# Patient Record
Sex: Male | Born: 1985 | Race: Black or African American | Hispanic: No | Marital: Married | Smoking: Never smoker
Health system: Southern US, Community
[De-identification: ages and names within clinical notes are randomized; demographics above are authoritative.]

## PROBLEM LIST (undated history)

## (undated) DIAGNOSIS — F32A Depression, unspecified: Secondary | ICD-10-CM

## (undated) DIAGNOSIS — I10 Essential (primary) hypertension: Secondary | ICD-10-CM

## (undated) DIAGNOSIS — R000 Tachycardia, unspecified: Secondary | ICD-10-CM

## (undated) DIAGNOSIS — F419 Anxiety disorder, unspecified: Secondary | ICD-10-CM

## (undated) DIAGNOSIS — G8929 Other chronic pain: Secondary | ICD-10-CM

## (undated) DIAGNOSIS — S0291XA Unspecified fracture of skull, initial encounter for closed fracture: Secondary | ICD-10-CM

## (undated) HISTORY — DX: Anxiety disorder, unspecified: F41.9

## (undated) HISTORY — DX: Depression, unspecified: F32.A

## (undated) HISTORY — DX: Other chronic pain: G89.29

## (undated) HISTORY — DX: Tachycardia, unspecified: R00.0

---

## 2014-01-19 ENCOUNTER — Emergency Department: Payer: Self-pay | Admitting: Internal Medicine

## 2014-01-19 LAB — CBC WITH DIFFERENTIAL/PLATELET
Basophil #: 0 10*3/uL (ref 0.0–0.1)
Basophil %: 0.4 %
EOS ABS: 0.1 10*3/uL (ref 0.0–0.7)
EOS PCT: 0.6 %
HCT: 40.3 % (ref 40.0–52.0)
HGB: 12.8 g/dL — ABNORMAL LOW (ref 13.0–18.0)
LYMPHS PCT: 15.8 %
Lymphocyte #: 1.6 10*3/uL (ref 1.0–3.6)
MCH: 24.9 pg — ABNORMAL LOW (ref 26.0–34.0)
MCHC: 31.7 g/dL — ABNORMAL LOW (ref 32.0–36.0)
MCV: 79 fL — AB (ref 80–100)
MONOS PCT: 16.1 %
Monocyte #: 1.6 x10 3/mm — ABNORMAL HIGH (ref 0.2–1.0)
Neutrophil #: 6.7 10*3/uL — ABNORMAL HIGH (ref 1.4–6.5)
Neutrophil %: 67.1 %
Platelet: 230 10*3/uL (ref 150–440)
RBC: 5.12 10*6/uL (ref 4.40–5.90)
RDW: 16 % — ABNORMAL HIGH (ref 11.5–14.5)
WBC: 10 10*3/uL (ref 3.8–10.6)

## 2014-01-19 LAB — COMPREHENSIVE METABOLIC PANEL
ALK PHOS: 72 U/L
ANION GAP: 5 — AB (ref 7–16)
Albumin: 3.3 g/dL — ABNORMAL LOW (ref 3.4–5.0)
BUN: 9 mg/dL (ref 7–18)
Bilirubin,Total: 0.3 mg/dL (ref 0.2–1.0)
CALCIUM: 8.5 mg/dL (ref 8.5–10.1)
CO2: 28 mmol/L (ref 21–32)
Chloride: 101 mmol/L (ref 98–107)
Creatinine: 0.9 mg/dL (ref 0.60–1.30)
Glucose: 86 mg/dL (ref 65–99)
Osmolality: 266 (ref 275–301)
POTASSIUM: 3.9 mmol/L (ref 3.5–5.1)
SGOT(AST): 30 U/L (ref 15–37)
SGPT (ALT): 46 U/L
Sodium: 134 mmol/L — ABNORMAL LOW (ref 136–145)
TOTAL PROTEIN: 8.4 g/dL — AB (ref 6.4–8.2)

## 2014-01-19 LAB — RAPID INFLUENZA A&B ANTIGENS (ARMC ONLY)

## 2014-01-19 LAB — CK TOTAL AND CKMB (NOT AT ARMC)
CK, TOTAL: 261 U/L
CK-MB: 0.5 ng/mL (ref 0.5–3.6)

## 2014-01-21 LAB — BETA STREP CULTURE(ARMC)

## 2014-01-24 LAB — CULTURE, BLOOD (SINGLE)

## 2014-07-07 ENCOUNTER — Emergency Department: Payer: Self-pay

## 2014-08-28 ENCOUNTER — Emergency Department
Admission: EM | Admit: 2014-08-28 | Discharge: 2014-08-28 | Disposition: A | Discharge status: Home / Self Care | Payer: Worker's Compensation | Attending: Emergency Medicine | Admitting: Emergency Medicine

## 2014-08-28 ENCOUNTER — Encounter: Payer: Self-pay | Admitting: Emergency Medicine

## 2014-08-28 ENCOUNTER — Emergency Department: Payer: Worker's Compensation

## 2014-08-28 DIAGNOSIS — R0789 Other chest pain: Secondary | ICD-10-CM

## 2014-08-28 DIAGNOSIS — R079 Chest pain, unspecified: Secondary | ICD-10-CM | POA: Diagnosis present

## 2014-08-28 HISTORY — DX: Unspecified fracture of skull, initial encounter for closed fracture: S02.91XA

## 2014-08-28 MED ORDER — OXYCODONE-ACETAMINOPHEN 5-325 MG PO TABS
1.0000 | ORAL_TABLET | Freq: Once | ORAL | Status: AC
  Administered 2014-08-28: 1 via ORAL

## 2014-08-28 MED ORDER — OXYCODONE-ACETAMINOPHEN 5-325 MG PO TABS
ORAL_TABLET | ORAL | Status: AC
  Administered 2014-08-28: 1 via ORAL
  Filled 2014-08-28: qty 1

## 2014-08-28 MED ORDER — IBUPROFEN 800 MG PO TABS
ORAL_TABLET | ORAL | Status: AC
  Administered 2014-08-28: 800 mg via ORAL
  Filled 2014-08-28: qty 25

## 2014-08-28 MED ORDER — IBUPROFEN 800 MG PO TABS
800.0000 mg | ORAL_TABLET | Freq: Once | ORAL | Status: AC
  Administered 2014-08-28: 800 mg via ORAL

## 2014-08-28 MED ORDER — IBUPROFEN 800 MG PO TABS: 800.0000 mg | ORAL_TABLET | Freq: Three times a day (TID) | ORAL | Status: DC | PRN

## 2014-08-28 NOTE — ED Notes (Signed)
MD Brown at bedside.

## 2014-08-28 NOTE — ED Notes (Signed)
Patient ambulatory to triage with steady gait, without difficulty or distress noted; pt reports mid CP since 8pm after "picking up a beam"; st hx of same and dx with pulled symptoms; denies any accomp symptoms

## 2014-08-28 NOTE — ED Provider Notes (Signed)
[facility] Emergency Department Provider Note  ____________________________________________  Time seen: 6:05 AM  I have reviewed the triage vital signs and the nursing notes.   HISTORY  Chief Complaint Chest Pain      HPI Stephen Bautista is a 29 y.o. male presents with central chest pain worse with movement and deep breaths after lifting heavy object at work. Pain is currently 7 out of 10 and described as sharp    Past Medical History  Diagnosis Date  . Skull fracture     There are no active problems to display for this patient.   History reviewed. No pertinent past surgical history.  Current Outpatient Rx  Name  Route  Sig  Dispense  Refill  . ibuprofen (ADVIL,MOTRIN) 800 MG tablet   Oral   Take 1 tablet (800 mg total) by mouth every 8 (eight) hours as needed.   30 tablet   0     Allergies Review of patient's allergies indicates no known allergies.  No family history on file.  Social History History  Substance Use Topics  . Smoking status: Not on file  . Smokeless tobacco: Never Used  . Alcohol Use: No    Review of Systems  Constitutional: Negative for fever. Eyes: Negative for visual changes. ENT: Negative for sore throat. Cardiovascular: Positive for chest pain. Respiratory: Negative for shortness of breath. Gastrointestinal: Negative for abdominal pain, vomiting and diarrhea. Genitourinary: Negative for dysuria. Musculoskeletal: Negative for back pain. Skin: Negative for rash. Neurological: Negative for headaches, focal weakness or numbness.   10-point ROS otherwise negative.  ____________________________________________   PHYSICAL EXAM:  VITAL SIGNS: ED Triage Vitals  Enc Vitals Group     BP 08/28/14 0105 129/110 mmHg     Pulse Rate 08/28/14 0105 69     Resp 08/28/14 0435 16     Temp 08/28/14 0105 97.9 F (36.6 C)     Temp Source 08/28/14 0105 Oral     SpO2 08/28/14 0105 99 %     Weight 08/28/14  0105 280 lb (127.007 kg)     Height 08/28/14 0105 5\' 9"  (1.753 m)     Head Cir --      Peak Flow --      Pain Score 08/28/14 0432 5     Pain Loc --      Pain Edu? --      Excl. in GC? --      Constitutional: Alert and oriented. Well appearing and in no distress. Eyes: Conjunctivae are normal. PERRL. Normal extraocular movements. ENT   Head: Normocephalic and atraumatic.   Nose: No congestion/rhinnorhea.   Mouth/Throat: Mucous membranes are moist.   Neck: No stridor. Hematological/Lymphatic/Immunilogical: No cervical lymphadenopathy. Cardiovascular: Normal rate, regular rhythm. Normal and symmetric distal pulses are present in all extremities. No murmurs, rubs, or gallops. Pain with palpation along the pectoralis major insertion. Respiratory: Normal respiratory effort without tachypnea nor retractions. Breath sounds are clear and equal bilaterally. No wheezes/rales/rhonchi. Gastrointestinal: Soft and nontender. No distention. There is no CVA tenderness. Genitourinary: deferred Musculoskeletal: Nontender with normal range of motion in all extremities. No joint effusions.  No lower extremity tenderness nor edema. Neurologic:  Normal speech and language. No gross focal neurologic deficits are appreciated. Speech is normal.  Skin:  Skin is warm, dry and intact. No rash noted. Psychiatric: Mood and affect are normal. Speech and behavior are normal. Patient exhibits appropriate insight and judgment.  ____________________________________________     ____________________________________________   EKG  Date: 08/28/2014  Rate: 70  Rhythm: normal sinus rhythm  QRS Axis: normal  Intervals: normal  ST/T Wave abnormalities: normal  Conduction Disutrbances: none  Narrative Interpretation: unremarkable      ____________________________________________     RADIOLOGY    ____________________________________________    ____________________________________________   INITIAL IMPRESSION / ASSESSMENT AND PLAN / ED COURSE  Pertinent labs & imaging results that were available during my care of the patient were reviewed by me and considered in my medical decision making (see chart for details).  Given history and physical exam negative EKG and chest x-ray concern for chest wall pain patient received analgesia here in the emergency department will be discharged home with ibuprofen 800 mg.  ____________________________________________   FINAL CLINICAL IMPRESSION(S) / ED DIAGNOSES  Final diagnoses:  Anterior chest wall pain      Darci Currentandolph N Brown, MD 08/28/14 [phone]

## 2014-08-28 NOTE — ED Notes (Signed)
No further protocols ordered per Dr Manson PasseyBrown; ED tech in triage to complete workers comp profile requirements

## 2014-08-28 NOTE — Discharge Instructions (Signed)

## 2014-08-28 NOTE — ED Notes (Signed)
Patient transported to X-ray 

## 2014-08-28 NOTE — ED Notes (Signed)
Patient returned from X-ray 

## 2014-08-28 NOTE — ED Notes (Signed)
Pt provided with a copy of the return to work form.

## 2014-11-26 ENCOUNTER — Emergency Department: Payer: Self-pay

## 2014-11-26 ENCOUNTER — Emergency Department
Admission: EM | Admit: 2014-11-26 | Discharge: 2014-11-26 | Disposition: A | Discharge status: Home / Self Care | Payer: Self-pay | Attending: Emergency Medicine | Admitting: Emergency Medicine

## 2014-11-26 ENCOUNTER — Encounter: Payer: Self-pay | Admitting: Emergency Medicine

## 2014-11-26 ENCOUNTER — Other Ambulatory Visit: Payer: Self-pay

## 2014-11-26 ENCOUNTER — Emergency Department: Payer: PRIVATE HEALTH INSURANCE

## 2014-11-26 DIAGNOSIS — J690 Pneumonitis due to inhalation of food and vomit: Secondary | ICD-10-CM | POA: Insufficient documentation

## 2014-11-26 DIAGNOSIS — R079 Chest pain, unspecified: Secondary | ICD-10-CM | POA: Insufficient documentation

## 2014-11-26 LAB — BASIC METABOLIC PANEL
Anion gap: 6 (ref 5–15)
BUN: 7 mg/dL (ref 6–20)
CALCIUM: 9 mg/dL (ref 8.9–10.3)
CHLORIDE: 98 mmol/L — AB (ref 101–111)
CO2: 29 mmol/L (ref 22–32)
CREATININE: 0.83 mg/dL (ref 0.61–1.24)
GFR calc Af Amer: >60 mL/min (ref >60)
GFR calc non Af Amer: >60 mL/min (ref >60)
Glucose, Bld: 105 mg/dL — ABNORMAL HIGH (ref 65–99)
POTASSIUM: 3.8 mmol/L (ref 3.5–5.1)
Sodium: 133 mmol/L — ABNORMAL LOW (ref 135–145)

## 2014-11-26 LAB — FIBRIN DERIVATIVES D-DIMER (ARMC ONLY): FIBRIN DERIVATIVES D-DIMER (ARMC): 650 — AB (ref 0–499)

## 2014-11-26 LAB — CBC
HCT: 40.8 % (ref 40.0–52.0)
HEMOGLOBIN: 13.2 g/dL (ref 13.0–18.0)
MCH: 25.3 pg — ABNORMAL LOW (ref 26.0–34.0)
MCHC: 32.4 g/dL (ref 32.0–36.0)
MCV: 78.1 fL — AB (ref 80.0–100.0)
Platelets: 226 10*3/uL (ref 150–440)
RBC: 5.22 MIL/uL (ref 4.40–5.90)
RDW: 16 % — ABNORMAL HIGH (ref 11.5–14.5)
WBC: 7.6 10*3/uL (ref 3.8–10.6)

## 2014-11-26 LAB — TROPONIN I

## 2014-11-26 MED ORDER — HYDROCOD POLST-CPM POLST ER 10-8 MG/5ML PO SUER: 5.0000 mL | Freq: Two times a day (BID) | ORAL | Status: DC

## 2014-11-26 MED ORDER — OXYCODONE-ACETAMINOPHEN 5-325 MG PO TABS
2.0000 | ORAL_TABLET | Freq: Once | ORAL | Status: AC
  Administered 2014-11-26: 2 via ORAL

## 2014-11-26 MED ORDER — GI COCKTAIL ~~LOC~~
ORAL | Status: AC
  Administered 2014-11-26: 30 mL via ORAL
  Filled 2014-11-26: qty 30

## 2014-11-26 MED ORDER — CLINDAMYCIN HCL 300 MG PO CAPS: 600.0000 mg | ORAL_CAPSULE | Freq: Two times a day (BID) | ORAL | Status: DC

## 2014-11-26 MED ORDER — OXYCODONE-ACETAMINOPHEN 5-325 MG PO TABS
ORAL_TABLET | ORAL | Status: AC
  Administered 2014-11-26: 2 via ORAL
  Filled 2014-11-26: qty 2

## 2014-11-26 MED ORDER — GI COCKTAIL ~~LOC~~
30.0000 mL | Freq: Once | ORAL | Status: AC
  Administered 2014-11-26: 30 mL via ORAL

## 2014-11-26 MED ORDER — IOHEXOL 350 MG/ML SOLN
100.0000 mL | Freq: Once | INTRAVENOUS | Status: AC | PRN
  Administered 2014-11-26: 100 mL via INTRAVENOUS

## 2014-11-26 NOTE — ED Notes (Signed)
Patient to ED with report of chest pain that woke him up this morning, reports that initially he felt like he had swallowed something and was choking, eventually he reports some vomiting but it did not relieve the pain.

## 2014-11-26 NOTE — Discharge Instructions (Signed)
Aspiration Pneumonia  Aspiration pneumonia is an infection in your lungs. It occurs when food, liquid, or stomach contents (vomit) are inhaled (aspirated) into your lungs. When these things get into your lungs, swelling (inflammation) and infection can occur. This can make it difficult for you to breathe. Aspiration pneumonia is a serious condition and can be life threatening. RISK FACTORS Aspiration pneumonia is more likely to occur when a person's cough (gag) reflex or ability to swallow has been decreased. Some things that can do this include:   Having a brain injury or disease, such as stroke, seizures, Parkinson's disease, dementia, or amyotrophic lateral sclerosis (ALS).   Being given general anesthetic for procedures.   Being in a coma (unconscious).   Having a narrowing of the tube that carries food to the stomach (esophagus).   Drinking too much alcohol. If a person passes out and vomits, vomit can be swallowed into the lungs.   Taking certain medicines, such as tranquilizers or sedatives.  SIGNS AND SYMPTOMS   Coughing after swallowing food or liquids.   Breathing problems, such as wheezing or shortness of breath.   Bluish skin. This can be caused by lack of oxygen.   Coughing up food or mucus. The mucus might contain blood, greenish material, or yellowish-white fluid (pus).   Fever.   Chest pain.   Being more tired than usual (fatigue).   Sweating more than usual.   Bad breath.  DIAGNOSIS  A physical exam will be done. During the exam, the health care provider will listen to your lungs with a stethoscope to check for:   Crackling sounds in the lungs.  Decreased breath sounds.  A rapid heartbeat. Various tests may be ordered. These may include:   Chest X-ray.   CT scan.   Swallowing study. This test looks at how food is swallowed and whether it goes into your breathing tube (trachea) or food pipe (esophagus).   Sputum culture. Saliva and  mucus (sputum) are collected from the lungs or the tubes that carry air to the lungs (bronchi). The sputum is then tested for bacteria.   Bronchoscopy. This test uses a flexible tube (bronchoscope) to see inside the lungs. TREATMENT  Treatment will usually include antibiotic medicines. Other medicines may also be used to reduce fever or pain. You may need to be treated in the hospital. In the hospital, your breathing will be carefully monitored. Depending on how well you are breathing, you may need to be given oxygen, or you may need breathing support from a breathing machine (ventilator). For people who fail a swallowing study, a feeding tube might be placed in the stomach, or they may be asked to avoid certain food textures or liquids when they eat. HOME CARE INSTRUCTIONS   Carefully follow any special eating instructions you were given, such as avoiding certain food textures or thickening liquids. This reduces the risk of developing aspiration pneumonia again.  Only take over-the-counter or prescription medicines as directed by your health care provider. Follow the directions carefully.   If you were prescribed antibiotics, take them as directed. Finish them even if you start to feel better.   Rest as instructed by your health care provider.   Keep all follow-up appointments with your health care provider.  SEEK MEDICAL CARE IF:   You develop worsening shortness of breath, wheezing, or difficulty breathing.   You develop a fever.   You have chest pain.  MAKE SURE YOU:   Understand these instructions.  Will watch   condition.  Will get help right away if you are not doing well or get worse. Document Released: 02/01/2009 Document Revised: 04/11/2013 Document Reviewed: 09/22/2012 [facility] Patient Information 2015 [place], [place]. This information is not intended to replace advice given to you by your health care provider. Make sure you discuss any questions you have with  your health care provider.  Chest Pain (Nonspecific) It is often hard to give a specific diagnosis for the cause of chest pain. There is always a chance that your pain could be related to something serious, such as a heart attack or a blood clot in the lungs. You need to follow up with your health care provider for further evaluation. CAUSES   Heartburn.  Pneumonia or bronchitis.  Anxiety or stress.  Inflammation around your heart (pericarditis) or lung (pleuritis or pleurisy).  A blood clot in the lung.  A collapsed lung (pneumothorax). It can develop suddenly on its own (spontaneous pneumothorax) or from trauma to the chest.  Shingles infection (herpes zoster virus). The chest wall is composed of bones, muscles, and cartilage. Any of these can be the source of the pain.  The bones can be bruised by injury.  The muscles or cartilage can be strained by coughing or overwork.  The cartilage can be affected by inflammation and become sore (costochondritis). DIAGNOSIS  Lab tests or other studies may be needed to find the cause of your pain. Your health care provider may have you take a test called an ambulatory electrocardiogram (ECG). An ECG records your heartbeat patterns over a 24-hour period. You may also have other tests, such as:  Transthoracic echocardiogram (TTE). During echocardiography, sound waves are used to evaluate how blood flows through your heart.  Transesophageal echocardiogram (TEE).  Cardiac monitoring. This allows your health care provider to monitor your heart rate and rhythm in real time.  Holter monitor. This is a portable device that records your heartbeat and can help diagnose heart arrhythmias. It allows your health care provider to track your heart activity for several days, if needed.  Stress tests by exercise or by giving medicine that makes the heart beat faster. TREATMENT   Treatment depends on what may be causing your chest pain. Treatment may  include:  Acid blockers for heartburn.  Anti-inflammatory medicine.  Pain medicine for inflammatory conditions.  Antibiotics if an infection is present.  You may be advised to change lifestyle habits. This includes stopping smoking and avoiding alcohol, caffeine, and chocolate.  You may be advised to keep your head raised (elevated) when sleeping. This reduces the chance of acid going backward from your stomach into your esophagus. Most of the time, nonspecific chest pain will improve within 2-3 days with rest and mild pain medicine.  HOME CARE INSTRUCTIONS   If antibiotics were prescribed, take them as directed. Finish them even if you start to feel better.  For the next few days, avoid physical activities that bring on chest pain. Continue physical activities as directed.  Do not use any tobacco products, including cigarettes, chewing tobacco, or electronic cigarettes.  Avoid drinking alcohol.  Only take medicine as directed by your health care provider.  Follow your health care provider's suggestions for further testing if your chest pain does not go away.  Keep any follow-up appointments you made. If you do not go to an appointment, you could develop lasting (chronic) problems with pain. If there is any problem keeping an appointment, call to reschedule. SEEK MEDICAL CARE IF:   Your chest  pain does not go away, even after treatment.  You have a rash with blisters on your chest.  You have a fever. SEEK IMMEDIATE MEDICAL CARE IF:   You have increased chest pain or pain that spreads to your arm, neck, jaw, back, or abdomen.  You have shortness of breath.  You have an increasing cough, or you cough up blood.  You have severe back or abdominal pain.  You feel nauseous or vomit.  You have severe weakness.  You faint.  You have chills. This is an emergency. Do not wait to see if the pain will go away. Get medical help at once. Call your local emergency services (911  in [place] Do not drive yourself to the hospital. MAKE SURE YOU:   Understand these instructions.  Will watch your condition.  Will get help right away if you are not doing well or get worse. Document Released: 01/14/2005 Document Revised: 04/11/2013 Document Reviewed: 11/10/2007 [facility] Patient Information 2015 [place], [place]. This information is not intended to replace advice given to you by your health care provider. Make sure you discuss any questions you have with your health care provider.

## 2014-11-26 NOTE — ED Provider Notes (Signed)
[facility] Emergency Department Provider Note     Time seen: ----------------------------------------- 5:37 PM on 11/26/2014 -----------------------------------------    I have reviewed the triage vital signs and the nursing notes.   HISTORY  Chief Complaint Chest Pain    HPI Stephen Bautista is a 29 y.o. male who presents ER for chest pain that woke him up this morning. Patient reports initially felt like he swallowed something and was choking. Eventually he reports some vomiting that did not relieve any pain. There is some soreness to his chest, nothing sees make it better or worse. He was seen here in May, but this feels different.   Past Medical History  Diagnosis Date  . Skull fracture     There are no active problems to display for this patient.   History reviewed. No pertinent past surgical history.  Allergies Review of patient's allergies indicates no known allergies.  Social History History  Substance Use Topics  . Smoking status: Never Smoker   . Smokeless tobacco: Never Used  . Alcohol Use: No    Review of Systems Constitutional: Negative for fever. Eyes: Negative for visual changes. ENT: Negative for sore throat. Cardiovascular: Positive for chest pain Respiratory: Negative for shortness of breath. Gastrointestinal: Negative for abdominal pain, vomiting and diarrhea. Genitourinary: Negative for dysuria. Musculoskeletal: Negative for back pain. Skin: Negative for rash. Neurological: Negative for headaches, focal weakness or numbness.  10-point ROS otherwise negative.  ____________________________________________   PHYSICAL EXAM:  VITAL SIGNS: ED Triage Vitals  Enc Vitals Group     BP 11/26/14 1630 143/83 mmHg     Pulse Rate 11/26/14 1630 110     Resp 11/26/14 1630 20     Temp 11/26/14 1630 98.5 F (36.9 C)     Temp Source 11/26/14 1630 Oral     SpO2 11/26/14 1630 97 %     Weight 11/26/14 1630 270 lb  (122.471 kg)     Height 11/26/14 1630 5\' 10"  (1.778 m)     Head Cir --      Peak Flow --      Pain Score 11/26/14 1631 3     Pain Loc --      Pain Edu? --      Excl. in GC? --     Constitutional: Alert and oriented. Well appearing and in no distress. Eyes: Conjunctivae are normal. PERRL. Normal extraocular movements. ENT   Head: Normocephalic and atraumatic.   Nose: No congestion/rhinnorhea.   Mouth/Throat: Mucous membranes are moist.   Neck: No stridor. Cardiovascular: Normal rate, regular rhythm. Normal and symmetric distal pulses are present in all extremities. No murmurs, rubs, or gallops. Respiratory: Normal respiratory effort without tachypnea nor retractions. Breath sounds are clear and equal bilaterally. No wheezes/rales/rhonchi. Gastrointestinal: Soft and nontender. No distention. No abdominal bruits.  Musculoskeletal: Nontender with normal range of motion in all extremities. No joint effusions.  No lower extremity tenderness nor edema. There is some midsternal reproducible chest wall tenderness Neurologic:  Normal speech and language. No gross focal neurologic deficits are appreciated. Speech is normal. No gait instability. Skin:  Skin is warm, dry and intact. No rash noted. Psychiatric: Mood and affect are normal. Speech and behavior are normal. Patient exhibits appropriate insight and judgment. ____________________________________________  EKG: Interpreted by me. Sinus tachycardia with a rate of 107 bpm, inverted T waves inferior laterally, normal PR interval, normal QS with, normal QT interval.  ____________________________________________  ED COURSE:  Pertinent labs & imaging results that were  available during my care of the patient were reviewed by me and considered in my medical decision making (see chart for details). Patient a cardiac labs, consider d-dimer and chest x-ray ____________________________________________    LABS (pertinent  positives/negatives)  Labs Reviewed  BASIC METABOLIC PANEL - Abnormal; Notable for the following:    Sodium 133 (*)    Chloride 98 (*)    Glucose, Bld 105 (*)    All other components within normal limits  CBC - Abnormal; Notable for the following:    MCV 78.1 (*)    MCH 25.3 (*)    RDW 16.0 (*)    All other components within normal limits  FIBRIN DERIVATIVES D-DIMER ([facility] ONLY) - Abnormal; Notable for the following:    Fibrin derivatives D-dimer ([facility]) 650 (*)    All other components within normal limits  TROPONIN I    RADIOLOGY Images were viewed by me  Chest x-ray  IMPRESSION: 1. No evidence of pulmonary embolism. 2. There is a spectrum findings in the right lower lobe which suggests sequela of mild aspiration. Alternatively, this could reflect a developing bronchopneumonia. 3. Mild air trapping, indicative of mild small airways disease. 4. Mild cardiomegaly. ____________________________________________  FINAL ASSESSMENT AND PLAN  Chest pain, likely aspiration pneumonia  Plan: Patient with labs and imaging as dictated above. Patient will be discharged with antibiotic coverage for aspiration pneumonia. He'll be given cough medicine to take, is encouraged to follow-up in 2-3 days for reevaluation.   Emily Filbert, MD   Emily Filbert, MD 11/26/14 [phone]

## 2015-04-30 ENCOUNTER — Encounter: Payer: Self-pay | Admitting: Emergency Medicine

## 2015-04-30 ENCOUNTER — Emergency Department
Admission: EM | Admit: 2015-04-30 | Discharge: 2015-04-30 | Disposition: A | Discharge status: Home / Self Care | Payer: PRIVATE HEALTH INSURANCE | Attending: Emergency Medicine | Admitting: Emergency Medicine

## 2015-04-30 DIAGNOSIS — K529 Noninfective gastroenteritis and colitis, unspecified: Secondary | ICD-10-CM | POA: Insufficient documentation

## 2015-04-30 DIAGNOSIS — R112 Nausea with vomiting, unspecified: Secondary | ICD-10-CM | POA: Diagnosis present

## 2015-04-30 LAB — CBC WITH DIFFERENTIAL/PLATELET
BASOS ABS: 0 10*3/uL (ref 0–0.1)
BASOS PCT: 0 %
EOS PCT: 4 %
Eosinophils Absolute: 0.3 10*3/uL (ref 0–0.7)
HCT: 41.4 % (ref 40.0–52.0)
Hemoglobin: 13 g/dL (ref 13.0–18.0)
LYMPHS ABS: 2 10*3/uL (ref 1.0–3.6)
Lymphocytes Relative: 29 %
MCH: 24.2 pg — AB (ref 26.0–34.0)
MCHC: 31.5 g/dL — AB (ref 32.0–36.0)
MCV: 76.8 fL — AB (ref 80.0–100.0)
Monocytes Absolute: 0.8 10*3/uL (ref 0.2–1.0)
Monocytes Relative: 12 %
NEUTROS PCT: 55 %
Neutro Abs: 3.6 10*3/uL (ref 1.4–6.5)
PLATELETS: 231 10*3/uL (ref 150–440)
RBC: 5.39 MIL/uL (ref 4.40–5.90)
RDW: 15.6 % — AB (ref 11.5–14.5)
WBC: 6.7 10*3/uL (ref 3.8–10.6)

## 2015-04-30 LAB — COMPREHENSIVE METABOLIC PANEL
ALT: 37 U/L (ref 17–63)
ANION GAP: 3 — AB (ref 5–15)
AST: 30 U/L (ref 15–41)
Albumin: 3.8 g/dL (ref 3.5–5.0)
Alkaline Phosphatase: 68 U/L (ref 38–126)
BUN: 11 mg/dL (ref 6–20)
CHLORIDE: 106 mmol/L (ref 101–111)
CO2: 28 mmol/L (ref 22–32)
CREATININE: 0.79 mg/dL (ref 0.61–1.24)
Calcium: 8.4 mg/dL — ABNORMAL LOW (ref 8.9–10.3)
Glucose, Bld: 89 mg/dL (ref 65–99)
Potassium: 3.7 mmol/L (ref 3.5–5.1)
Sodium: 137 mmol/L (ref 135–145)
Total Bilirubin: 0.5 mg/dL (ref 0.3–1.2)
Total Protein: 8.4 g/dL — ABNORMAL HIGH (ref 6.5–8.1)

## 2015-04-30 LAB — LIPASE, BLOOD: LIPASE: 29 U/L (ref 11–51)

## 2015-04-30 MED ORDER — LOPERAMIDE HCL 2 MG PO CAPS
4.0000 mg | ORAL_CAPSULE | Freq: Once | ORAL | Status: AC
  Administered 2015-04-30: 4 mg via ORAL
  Filled 2015-04-30: qty 2

## 2015-04-30 MED ORDER — ONDANSETRON HCL 4 MG/2ML IJ SOLN
4.0000 mg | Freq: Once | INTRAMUSCULAR | Status: AC
  Administered 2015-04-30: 4 mg via INTRAVENOUS
  Filled 2015-04-30: qty 2

## 2015-04-30 MED ORDER — SODIUM CHLORIDE 0.9 % IV SOLN
1000.0000 mL | Freq: Once | INTRAVENOUS | Status: AC
  Administered 2015-04-30: 1000 mL via INTRAVENOUS

## 2015-04-30 MED ORDER — DICYCLOMINE HCL 20 MG PO TABS: 20.0000 mg | ORAL_TABLET | Freq: Three times a day (TID) | ORAL | Status: DC | PRN

## 2015-04-30 MED ORDER — ONDANSETRON HCL 4 MG PO TABS: 4.0000 mg | ORAL_TABLET | Freq: Every day | ORAL | Status: DC | PRN

## 2015-04-30 MED ORDER — MORPHINE SULFATE (PF) 4 MG/ML IV SOLN
4.0000 mg | Freq: Once | INTRAVENOUS | Status: AC
Start: 2015-04-30 — End: 2015-04-30
  Administered 2015-04-30: 4 mg via INTRAVENOUS
  Filled 2015-04-30: qty 1

## 2015-04-30 NOTE — ED Notes (Signed)
Pt states he has taken alka seltzer, ginger ale and pepto with no relief.

## 2015-04-30 NOTE — ED Notes (Signed)
Pt given water and crackers for PO challenge

## 2015-04-30 NOTE — ED Provider Notes (Signed)
[facility] Emergency Department Provider Note     Time seen: ----------------------------------------- 8:03 AM on 04/30/2015 -----------------------------------------    I have reviewed the triage vital signs and the nursing notes.   HISTORY  Chief Complaint Abdominal Cramping    HPI Myer Bohlman is a 30 y.o. male who presents to ERfor nausea vomiting and diarrhea. Patient states his diarrhea started on Saturday morning and estimates she's had 5-6 loose stools daily. His also thrown up 3 times while using the bathroom. His appetite is decreased, he's been taking Alka-Seltzer and Pepto-Bismol without any improvement. Dark stools concerned him this morning. He denies fevers chills or other complaints.   Past Medical History  Diagnosis Date  . Skull fracture (HCC)     There are no active problems to display for this patient.   History reviewed. No pertinent past surgical history.  Allergies Review of patient's allergies indicates no known allergies.  Social History Social History  Substance Use Topics  . Smoking status: Never Smoker   . Smokeless tobacco: Never Used  . Alcohol Use: No    Review of Systems Constitutional: Negative for fever. Eyes: Negative for visual changes. ENT: Negative for sore throat. Cardiovascular: Negative for chest pain. Respiratory: Negative for shortness of breath. Gastrointestinal:positive for abdominal cramping, nausea vomiting and diarrhea Genitourinary: Negative for dysuria. Musculoskeletal: Negative for back pain. Skin: Negative for rash. Neurological: Negative for headaches, focal weakness or numbness.  10-point ROS otherwise negative.  ____________________________________________   PHYSICAL EXAM:  VITAL SIGNS: ED Triage Vitals  Enc Vitals Group     BP 04/30/15 0742 145/80 mmHg     Pulse Rate 04/30/15 0742 76     Resp 04/30/15 0742 16     Temp 04/30/15 0742 98.1 F (36.7 C)     Temp  Source 04/30/15 0742 Oral     SpO2 04/30/15 0742 99 %     Weight 04/30/15 0742 275 lb (124.739 kg)     Height 04/30/15 0742 5\' 9"  (1.753 m)     Head Cir --      Peak Flow --      Pain Score 04/30/15 0743 6     Pain Loc --      Pain Edu? --      Excl. in GC? --     Constitutional: Alert and oriented. Well appearing and in no distress. Eyes: Conjunctivae are normal. PERRL. Normal extraocular movements. ENT   Head: Normocephalic and atraumatic.   Nose: No congestion/rhinnorhea.   Mouth/Throat: Mucous membranes are moist.   Neck: No stridor. Cardiovascular: Normal rate, regular rhythm. Normal and symmetric distal pulses are present in all extremities. No murmurs, rubs, or gallops. Respiratory: Normal respiratory effort without tachypnea nor retractions. Breath sounds are clear and equal bilaterally. No wheezes/rales/rhonchi. Gastrointestinal: mild abdominal tenderness, no rebound or guarding. Normal bowel sounds. Musculoskeletal: Nontender with normal range of motion in all extremities. No joint effusions.  No lower extremity tenderness nor edema. Neurologic:  Normal speech and language. No gross focal neurologic deficits are appreciated. Speech is normal. No gait instability. Skin:  Skin is warm, dry and intact. No rash noted. Psychiatric: Mood and affect are normal. Speech and behavior are normal. Patient exhibits appropriate insight and judgment. ____________________________________________  ED COURSE:  Pertinent labs & imaging results that were available during my care of the patient were reviewed by me and considered in my medical decision making (see chart for details). Patient likely would nor a virus infection, we'll give IV  fluid, IV morphine and Zofran. ____________________________________________    LABS (pertinent positives/negatives)  Labs Reviewed  CBC WITH DIFFERENTIAL/PLATELET - Abnormal; Notable for the following:    MCV 76.8 (*)    MCH 24.2 (*)     MCHC 31.5 (*)    RDW 15.6 (*)    All other components within normal limits  COMPREHENSIVE METABOLIC PANEL - Abnormal; Notable for the following:    Calcium 8.4 (*)    Total Protein 8.4 (*)    Anion gap 3 (*)    All other components within normal limits  LIPASE, BLOOD  ____________________________________________  FINAL ASSESSMENT AND PLAN  Gastroenteritis  Plan: Patient with labs and imaging as dictated above. Patient is feeling better and has tolerated by mouth well here. He'll be discharged with antiemetics and Bentyl. Have advised Imodium for diarrhea. He stable for outpatient follow-up with his doctor. Black stool that he noted is almost certainly related to the Pepto-Bismol intake.   Emily FilbertWilliams, Garnell Phenix E, MD   Emily FilbertJonathan E Yanni Ruberg, MD 04/30/15 [phone]

## 2015-04-30 NOTE — ED Notes (Signed)
Pt verbalized understanding of discharge instructions. NAD at this time. 

## 2015-04-30 NOTE — Discharge Instructions (Signed)
Norovirus Infection °A norovirus infection is caused by exposure to a virus in a group of similar viruses (noroviruses). This type of infection causes inflammation in your stomach and intestines (gastroenteritis). Norovirus is the most common cause of gastroenteritis. It also causes food poisoning. °Anyone can get a norovirus infection. It spreads very easily (contagious). You can get it from contaminated food, water, surfaces, or other people. Norovirus is found in the stool or vomit of infected people. You can spread the infection as soon as you feel sick until 2 weeks after you recover.  °Symptoms usually begin within 2 days after you become infected. Most norovirus symptoms affect the digestive system. °CAUSES °Norovirus infection is caused by contact with norovirus. You can catch norovirus if you: °· Eat or drink something contaminated with norovirus. °· Touch surfaces or objects contaminated with norovirus and then put your hand in your mouth. °· Have direct contact with an infected person who has symptoms. °· Share food, drink, or utensils with someone with who is sick with norovirus. °SIGNS AND SYMPTOMS °Symptoms of norovirus may include: °· Nausea. °· Vomiting. °· Diarrhea. °· Stomach cramps. °· Fever. °· Chills. °· Headache. °· Muscle aches. °· Tiredness. °DIAGNOSIS °Your health care provider may suspect norovirus based on your symptoms and physical exam. Your health care provider may also test a sample of your stool or vomit for the virus.  °TREATMENT °There is no specific treatment for norovirus. Most people get better without treatment in about 2 days. °HOME CARE INSTRUCTIONS °· Replace lost fluids by drinking plenty of water or rehydration fluids containing important minerals called electrolytes. This prevents dehydration. Drink enough fluid to keep your urine clear or pale yellow. °· Do not prepare food for others while you are infected. Wait at least 3 days after recovering from the illness to do  that. °PREVENTION  °· Wash your hands often, especially after using the toilet or changing a diaper. °· Wash fruits and vegetables thoroughly before preparing or serving them. °· Throw out any food that a sick person may have touched. °· Disinfect contaminated surfaces immediately after someone in the household has been sick. Use a bleach-based household cleaner. °· Immediately remove and wash soiled clothes or sheets. °SEEK MEDICAL CARE IF: °· Your vomiting, diarrhea, and stomach pain is getting worse. °· Your symptoms of norovirus do not go away after 2-3 days. °SEEK IMMEDIATE MEDICAL CARE IF:  °You develop symptoms of dehydration that do not improve with fluid replacement. This may include: °· Excessive sleepiness. °· Lack of tears. °· Dry mouth. °· Dizziness when standing. °· Weak pulse. °  °This information is not intended to replace advice given to you by your health care provider. Make sure you discuss any questions you have with your health care provider. °  °Document Released: 06/27/2002 Document Revised: 04/27/2014 Document Reviewed: 09/14/2013 °Elsevier Interactive Patient Education ©2016 Else[employer] Inc. ° °

## 2015-04-30 NOTE — ED Notes (Signed)
Pt was able to tolerate crackers and water without vomiting.

## 2015-04-30 NOTE — ED Notes (Signed)
Patient states his stomach began "rolling" with loose stools starting on Sat. AM, estimates 5-6 stools daily. Emesis X 3 while using bathroom. Appetite is decreased.  Burping "egg flavor"  States his stomach hurts when he touches it. Took Pepto-Bismol and Alka Seltzer.  States his stool dark black this AM.

## 2016-07-30 ENCOUNTER — Emergency Department
Admission: EM | Admit: 2016-07-30 | Discharge: 2016-07-30 | Disposition: A | Discharge status: Home / Self Care | Payer: PRIVATE HEALTH INSURANCE | Attending: Emergency Medicine | Admitting: Emergency Medicine

## 2016-07-30 ENCOUNTER — Encounter: Payer: Self-pay | Admitting: Emergency Medicine

## 2016-07-30 DIAGNOSIS — A0811 Acute gastroenteropathy due to Norwalk agent: Secondary | ICD-10-CM | POA: Insufficient documentation

## 2016-07-30 DIAGNOSIS — A084 Viral intestinal infection, unspecified: Secondary | ICD-10-CM | POA: Insufficient documentation

## 2016-07-30 LAB — COMPREHENSIVE METABOLIC PANEL
ALK PHOS: 64 U/L (ref 38–126)
ALT: 31 U/L (ref 17–63)
ANION GAP: 7 (ref 5–15)
AST: 28 U/L (ref 15–41)
Albumin: 4 g/dL (ref 3.5–5.0)
BUN: 9 mg/dL (ref 6–20)
CO2: 24 mmol/L (ref 22–32)
Calcium: 8.6 mg/dL — ABNORMAL LOW (ref 8.9–10.3)
Chloride: 104 mmol/L (ref 101–111)
Creatinine, Ser: 0.76 mg/dL (ref 0.61–1.24)
GFR calc Af Amer: >60 mL/min (ref >60)
GFR calc non Af Amer: >60 mL/min (ref >60)
GLUCOSE: 90 mg/dL (ref 65–99)
POTASSIUM: 3.6 mmol/L (ref 3.5–5.1)
Sodium: 135 mmol/L (ref 135–145)
Total Bilirubin: 0.4 mg/dL (ref 0.3–1.2)
Total Protein: 8.6 g/dL — ABNORMAL HIGH (ref 6.5–8.1)

## 2016-07-30 LAB — CBC
HEMATOCRIT: 41.9 % (ref 40.0–52.0)
HEMOGLOBIN: 13.6 g/dL (ref 13.0–18.0)
MCH: 25.6 pg — AB (ref 26.0–34.0)
MCHC: 32.5 g/dL (ref 32.0–36.0)
MCV: 78.8 fL — AB (ref 80.0–100.0)
Platelets: 261 10*3/uL (ref 150–440)
RBC: 5.32 MIL/uL (ref 4.40–5.90)
RDW: 15.3 % — AB (ref 11.5–14.5)
WBC: 7.4 10*3/uL (ref 3.8–10.6)

## 2016-07-30 LAB — LIPASE, BLOOD: LIPASE: 25 U/L (ref 11–51)

## 2016-07-30 MED ORDER — MORPHINE SULFATE (PF) 4 MG/ML IV SOLN
4.0000 mg | Freq: Once | INTRAVENOUS | Status: AC
  Administered 2016-07-30: 4 mg via INTRAVENOUS
  Filled 2016-07-30: qty 1

## 2016-07-30 MED ORDER — PROMETHAZINE HCL 25 MG PO TABS
25.0000 mg | ORAL_TABLET | Freq: Once | ORAL | Status: AC
  Administered 2016-07-30: 25 mg via ORAL
  Filled 2016-07-30: qty 1

## 2016-07-30 MED ORDER — ONDANSETRON HCL 4 MG/2ML IJ SOLN
4.0000 mg | Freq: Once | INTRAMUSCULAR | Status: AC
  Administered 2016-07-30: 4 mg via INTRAVENOUS
  Filled 2016-07-30: qty 2

## 2016-07-30 MED ORDER — SODIUM CHLORIDE 0.9 % IV SOLN
Freq: Once | INTRAVENOUS | Status: AC
  Administered 2016-07-30: 11:00:00 via INTRAVENOUS

## 2016-07-30 MED ORDER — DICYCLOMINE HCL 20 MG PO TABS: 20.0000 mg | ORAL_TABLET | Freq: Three times a day (TID) | ORAL | 0 refills | Status: DC | PRN

## 2016-07-30 MED ORDER — ONDANSETRON 4 MG PO TBDP: 4.0000 mg | ORAL_TABLET | Freq: Three times a day (TID) | ORAL | 0 refills | Status: DC | PRN

## 2016-07-30 NOTE — ED Provider Notes (Addendum)
[facility] Emergency Department Provider Note       Time seen: ----------------------------------------- 10:59 AM on 07/30/2016 -----------------------------------------     I have reviewed the triage vital signs and the nursing notes.   HISTORY   Chief Complaint Abdominal Pain    HPI Stephen Bautista is a 31 y.o. male who presents to the ED for abdominal pain with vomiting and diarrhea for the last 2 days. Patient states he has generalized pain, no fevers or chills, he feels like he may become dehydrated. Patient thinks he may have been from something that he ate, otherwise denies focal complaints   Past Medical History:  Diagnosis Date  . Skull fracture (HCC)     There are no active problems to display for this patient.   History reviewed. No pertinent surgical history.  Allergies Patient has no known allergies.  Social History Social History  Substance Use Topics  . Smoking status: Never Smoker  . Smokeless tobacco: Never Used  . Alcohol use No    Review of Systems Constitutional: Negative for fever. Cardiovascular: Negative for chest pain. Respiratory: Negative for shortness of breath. Gastrointestinal: Positive for abdominal pain, vomiting and diarrhea Genitourinary: Negative for dysuria. Musculoskeletal: Negative for back pain. Positive for myalgias Skin: Negative for rash. Neurological: Negative for headaches, focal weakness or numbness.  10-point ROS otherwise negative.  ____________________________________________   PHYSICAL EXAM:  VITAL SIGNS: ED Triage Vitals  Enc Vitals Group     BP 07/30/16 1041 139/80     Pulse Rate 07/30/16 1041 77     Resp 07/30/16 1041 18     Temp 07/30/16 1041 98.5 F (36.9 C)     Temp Source 07/30/16 1041 Oral     SpO2 07/30/16 1041 99 %     Weight 07/30/16 1040 275 lb (124.7 kg)     Height 07/30/16 1040  (1.778 m)     Head Circumference --      Peak Flow --      Pain  Score 07/30/16 1040 8     Pain Loc --      Pain Edu? --      Excl. in GC? --     Constitutional: Alert and oriented. Well appearing and in no distress. Eyes: Conjunctivae are normal. PERRL. Normal extraocular movements. ENT   Head: Normocephalic and atraumatic.   Nose: No congestion/rhinnorhea.   Mouth/Throat: Mucous membranes are moist.   Neck: No stridor. Cardiovascular: Normal rate, regular rhythm. No murmurs, rubs, or gallops. Respiratory: Normal respiratory effort without tachypnea nor retractions. Breath sounds are clear and equal bilaterally. No wheezes/rales/rhonchi. Gastrointestinal: Soft and nontender. Normal bowel sounds Musculoskeletal: Nontender with normal range of motion in extremities. No lower extremity tenderness nor edema. Neurologic:  Normal speech and language. No gross focal neurologic deficits are appreciated.  Skin:  Skin is warm, dry and intact. No rash noted. Psychiatric: Mood and affect are normal. Speech and behavior are normal.  ____________________________________________  ED COURSE:  Pertinent labs & imaging results that were available during my care of the patient were reviewed by me and considered in my medical decision making (see chart for details). Patient presents for symptoms of gastroenteritis, we will assess with labs and imaging as indicated.   Procedures ____________________________________________   LABS (pertinent positives/negatives)  Labs Reviewed  CBC - Abnormal; Notable for the following:       Result Value   MCV 78.8 (*)    MCH 25.6 (*)    RDW 15.3 (*)  All other components within normal limits  LIPASE, BLOOD  COMPREHENSIVE METABOLIC PANEL  URINALYSIS, COMPLETE (UACMP) WITH MICROSCOPIC   ____________________________________________  FINAL ASSESSMENT AND PLAN  Gastroenteritis  Plan: Patient's labs were dictated above. Patient had presented for vomiting and diarrhea, likely Norovirus infection. He's  received fluids as well as antiemetics and antidiarrheal agents. Currently he is stable for outpatient follow-up.   Emily Filbert, MD   Note: This note was generated in part or whole with voice recognition software. Voice recognition is usually quite accurate but there are transcription errors that can and very often do occur. I apologize for any typographical errors that were not detected and corrected.     Emily Filbert, MD 07/30/16 1100    Emily Filbert, MD 07/30/16 1101

## 2016-07-30 NOTE — ED Triage Notes (Signed)
Generalized abdominal pain with vomiting and diarrhea X 2 days. Has also had generalized pain. No urinary sx. No fevers.

## 2016-10-16 ENCOUNTER — Encounter: Payer: Self-pay | Admitting: Emergency Medicine

## 2016-10-16 ENCOUNTER — Emergency Department
Admission: EM | Admit: 2016-10-16 | Discharge: 2016-10-16 | Disposition: A | Discharge status: Home / Self Care | Payer: PRIVATE HEALTH INSURANCE | Attending: Student in an Organized Health Care Education/Training Program | Admitting: Student in an Organized Health Care Education/Training Program

## 2016-10-16 DIAGNOSIS — K0889 Other specified disorders of teeth and supporting structures: Secondary | ICD-10-CM | POA: Insufficient documentation

## 2016-10-16 MED ORDER — KETOROLAC TROMETHAMINE 10 MG PO TABS: 10.0000 mg | ORAL_TABLET | Freq: Four times a day (QID) | ORAL | 0 refills | Status: AC | PRN

## 2016-10-16 MED ORDER — TRAMADOL HCL 50 MG PO TABS: 50.0000 mg | ORAL_TABLET | Freq: Four times a day (QID) | ORAL | 0 refills | Status: AC | PRN

## 2016-10-16 MED ORDER — TRAMADOL HCL 50 MG PO TABS
50.0000 mg | ORAL_TABLET | Freq: Once | ORAL | Status: AC
  Administered 2016-10-16: 50 mg via ORAL
  Filled 2016-10-16: qty 1

## 2016-10-16 MED ORDER — KETOROLAC TROMETHAMINE 30 MG/ML IJ SOLN
30.0000 mg | Freq: Once | INTRAMUSCULAR | Status: AC
  Administered 2016-10-16: 30 mg via INTRAMUSCULAR
  Filled 2016-10-16: qty 1

## 2016-10-16 MED ORDER — AMOXICILLIN 500 MG PO TABS: 500.0000 mg | ORAL_TABLET | Freq: Three times a day (TID) | ORAL | 0 refills | Status: AC

## 2016-10-16 MED ORDER — LIDOCAINE VISCOUS 2 % MT SOLN
15.0000 mL | Freq: Once | OROMUCOSAL | Status: AC
  Administered 2016-10-16: 15 mL via OROMUCOSAL
  Filled 2016-10-16: qty 15

## 2016-10-16 NOTE — ED Triage Notes (Signed)
Pt. States upper rt. Tooth pain for awhile.  Pt. States chewing on food today cracked and expo used tooth nerve.

## 2016-10-16 NOTE — ED Provider Notes (Signed)
[facility] Emergency Department Provider Note  ____________________________________________  Time seen: Approximately 8:38 PM  I have reviewed the triage vital signs and the nursing notes.   HISTORY  Chief Complaint Dental Pain    HPI Stephen Bautista is a 31 y.o. male presenting to the emergency department with 8/10 throbbing and aching dental pain from Superior 1. Patient states that tooth cracked last night. Patient has noticed no fever, chills or gingival hypertrophy. No alleviating measures have been attempted   Past Medical History:  Diagnosis Date  . Skull fracture (HCC)     There are no active problems to display for this patient.   History reviewed. No pertinent surgical history.  Prior to Admission medications   Medication Sig Start Date End Date Taking? Authorizing Provider  amoxicillin (AMOXIL) 500 MG tablet Take 1 tablet (500 mg total) by mouth 3 (three) times daily. 10/16/16 10/26/16  Orvil FeilWoods, Jaclyn M, PA-C  dicyclomine (BENTYL) 20 MG tablet Take 1 tablet (20 mg total) by mouth 3 (three) times daily as needed for spasms. 07/30/16   Emily FilbertWilliams, Jonathan E, MD  ketorolac (TORADOL) 10 MG tablet Take 1 tablet (10 mg total) by mouth every 6 (six) hours as needed. 10/16/16 10/21/16  Orvil FeilWoods, Jaclyn M, PA-C  ondansetron (ZOFRAN ODT) 4 MG disintegrating tablet Take 1 tablet (4 mg total) by mouth every 8 (eight) hours as needed for nausea or vomiting. 07/30/16   Emily FilbertWilliams, Jonathan E, MD  traMADol (ULTRAM) 50 MG tablet Take 1 tablet (50 mg total) by mouth every 6 (six) hours as needed. 10/16/16 10/19/16  Orvil FeilWoods, Jaclyn M, PA-C    Allergies Patient has no known allergies.  History reviewed. No pertinent family history.  Social History Social History  Substance Use Topics  . Smoking status: Never Smoker  . Smokeless tobacco: Never Used  . Alcohol use No   Review of Systems  Constitutional: No fever/chills Eyes: No visual changes. No discharge ENT:  Patient has Superior 1 pain.  Cardiovascular: no chest pain. Respiratory: no cough. No SOB. Gastrointestinal: No abdominal pain.  No nausea, no vomiting.  No diarrhea.  No constipation. Musculoskeletal: Negative for musculoskeletal pain. Skin: Negative for rash, abrasions, lacerations, ecchymosis. Neurological: Negative for headaches, focal weakness or numbness.  ____________________________________________   PHYSICAL EXAM:  VITAL SIGNS: ED Triage Vitals  Enc Vitals Group     BP 10/16/16 1916 (!) 116/48     Pulse Rate 10/16/16 1916 91     Resp 10/16/16 1916 18     Temp 10/16/16 1916 99 F (37.2 C)     Temp Source 10/16/16 1916 Oral     SpO2 10/16/16 1916 99 %     Weight 10/16/16 1916 270 lb (122.5 kg)     Height 10/16/16 1916 5\' 10"  (1.778 m)     Head Circumference --      Peak Flow --      Pain Score 10/16/16 1915 8     Pain Loc --      Pain Edu? --      Excl. in GC? --      Constitutional: Alert and oriented. Well appearing and in no acute distress. Eyes: Conjunctivae are normal. PERRL. EOMI. Head: Atraumatic.      Mouth/Throat: Mucous membranes are moist. Patient has cracked superior 1. Hematological/Lymphatic/Immunilogical: No cervical lymphadenopathy. Cardiovascular: Normal rate, regular rhythm. Normal S1 and S2.  Good peripheral circulation. Respiratory: Normal respiratory effort without tachypnea or retractions. Lungs CTAB. Good air entry to the bases  with no decreased or absent breath sounds. Gastrointestinal: Bowel sounds 4 quadrants. Soft and nontender to palpation. No guarding or rigidity. No palpable masses. No distention. No CVA tenderness. Musculoskeletal: Full range of motion to all extremities. No gross deformities appreciated. Neurologic:  Normal speech and language. No gross focal neurologic deficits are appreciated.  Skin:  Skin is warm, dry and intact. No rash noted. Psychiatric: Mood and affect are normal. Speech and behavior are normal. Patient  exhibits appropriate insight and judgement.   ____________________________________________   LABS (all labs ordered are listed, but only abnormal results are displayed)  Labs Reviewed - No data to display ____________________________________________  EKG   ____________________________________________  RADIOLOGY  No results found.  ____________________________________________    PROCEDURES  Procedure(s) performed:    Procedures    Medications  ketorolac (TORADOL) 30 MG/ML injection 30 mg (30 mg Intramuscular Given 10/16/16 2052)  lidocaine (XYLOCAINE) 2 % viscous mouth solution 15 mL (15 mLs Mouth/Throat Given 10/16/16 2052)  traMADol (ULTRAM) tablet 50 mg (50 mg Oral Given 10/16/16 2052)     ____________________________________________   INITIAL IMPRESSION / ASSESSMENT AND PLAN / ED COURSE  Pertinent labs & imaging results that were available during my care of the patient were reviewed by me and considered in my medical decision making (see chart for details).  Review of the [place] CSRS was performed in accordance of the [facility] prior to dispensing any controlled drugs.     Assessment and plan: Dental pain: Patient presents to the emergency department with a cracked superior 1. He was given viscous lidocaine, tramadol, and toradol in the emergency department. He was discharged with amoxicillin and tramadol. Vital signs were reassuring prior to discharge. All patient questions were answered ____________________________________________  FINAL CLINICAL IMPRESSION(S) / ED DIAGNOSES  Final diagnoses:  Pain, dental      NEW MEDICATIONS STARTED DURING THIS VISIT:  Discharge Medication List as of 10/16/2016  8:33 PM    START taking these medications   Details  amoxicillin (AMOXIL) 500 MG tablet Take 1 tablet (500 mg total) by mouth 3 (three) times daily., Starting Fri 10/16/2016, Until Mon 10/26/2016, Print    ketorolac (TORADOL) 10 MG tablet Take 1 tablet (10 mg  total) by mouth every 6 (six) hours as needed., Starting Fri 10/16/2016, Until Wed 10/21/2016, Print    traMADol (ULTRAM) 50 MG tablet Take 1 tablet (50 mg total) by mouth every 6 (six) hours as needed., Starting Fri 10/16/2016, Until Mon 10/19/2016, Print            This chart was dictated using voice recognition software/Dragon. Despite best efforts to proofread, errors can occur which can change the meaning. Any change was purely unintentional.    Orvil Feil, PA-C 10/17/16 1747    Willy Eddy, MD 10/17/16 [phone]

## 2016-10-16 NOTE — Discharge Instructions (Signed)
OPTIONS FOR DENTAL FOLLOW UP CARE ° °[place] Department [facility] [employer] and [facility] - Local Safety Net Dental Clinics °http://www.ncdhhs.gov/dph/oralhealth/services/safetynetclinics.htm °  °[facility] (336-562-3123) ° °[facility] [place] ([phone]) ° °[facility] ([phone] ext [phone]) ° ° [facility] ([phone]) ° °[facility] ([phone]) °This clinic caters to the indigent population and is on a lottery system. °Location: °[facility], [address], [place] °Clinic Hours: °Wednesdays from 6pm - 9pm, patients seen by a lottery system. °For dates, call or go to [email or web address] °Services: °Cleanings, fillings and simple extractions. °Payment Options: °DENTAL WORK IS FREE OF CHARGE. Bring proof of income or support. °Best way to get seen: °Arrive at 5:15 pm - this is a lottery, NOT first come/first serve, so arriving earlier will not increase your chances of being seen. °  °  °[facility] °[phone] °Select option 1 for emergencies °  °Location: °[facility], [address], [place] °Clinic Hours: °No walk-ins accepted - call the day before to schedule an appointment. °Check in times are 9:30 am and 1:30 pm. °Services: °Simple extractions, temporary fillings, pulpectomy/pulp debridement, uncomplicated abscess drainage. °Payment Options: °PAYMENT IS DUE AT THE TIME OF SERVICE.  Fee is usually $100-200, additional surgical procedures (e.g. abscess drainage) may be extra. °Cash, checks, Visa/MasterCard accepted.  Can file Medicaid if patient is covered for dental - patient should call case worker to check. °No discount for [facility] patients. °Best way to get seen: °MUST call the day before and get onto the schedule. Can usually be seen the next 1-2 days. No walk-ins accepted. °  °  °[facility] °[phone] °   °Location: °[facility], [address], [place] °Clinic Hours: °M, W, Th, F 8am or 1:30pm, Tues 9a or 1:30 - first come/first served. °Services: °Simple extractions, temporary fillings, uncomplicated abscess drainage.  You do not need to be an [place] resident. °Payment Options: °PAYMENT IS DUE AT THE TIME OF SERVICE. °Dental insurance, otherwise sliding scale - bring proof of income or support. °Depending on income and treatment needed, cost is usually $50-200. °Best way to get seen: °Arrive early as it is first come/first served. °  °  °[facility] °[phone] °  °Location: °[address] °Clinic Hours: °Mon-Thu 8a-5p °Services: °Most basic dental services including extractions and fillings. °Payment Options: °PAYMENT IS DUE AT THE TIME OF SERVICE. °Sliding scale, up to 50% off - bring proof if income or support. °Medicaid with dental option accepted. °Best way to get seen: °Call to schedule an appointment, can usually be seen within 2 weeks OR they will try to see walk-ins - show up at 8a or 2p (you may have to wait). °  °  °[facility] °[phone] °[place] RESIDENTS ONLY °  °Location: °[facility], [address], [place], [place] [address] °Clinic Hours: By appointment only. °Monday - Thursday 8am-5pm, Friday 8am-12pm °Services: Cleanings, fillings, extractions. °Payment Options: °PAYMENT IS DUE AT THE TIME OF SERVICE. °Cash, Visa or MasterCard. Sliding scale - $30 minimum per service. °Best way to get seen: °Come in to office, complete packet and make an appointment - need proof of income °or support monies for each household member and proof of [place] residence. °Usually takes about a month to get in. °  °  °[facility] °[phone] °  °Location: °[address]   [place] °Clinic Hours: Walk-in Urgent Care Dental Services are offered Monday-Friday  mornings only. °The numbers of emergencies accepted daily is limited to the number of °providers available. °Maximum 15 - Mondays, Wednesdays & Thursdays °Maximum 10 - Tuesdays & Fridays °Services: °You do not need to be a [place] resident to be seen for a dental emergency. °Emergencies are defined as pain, swelling, abnormal bleeding, or dental trauma. Walkins will receive x-rays if needed. °NOTE: Dental cleaning is not an emergency. °Payment Options: °PAYMENT IS DUE AT THE TIME OF SERVICE. °Minimum co-pay is $40.00 for uninsured patients. °Minimum co-pay is $3.00 for Medicaid with dental coverage. °Dental Insurance is accepted and must be presented at time of visit. °Medicare does not cover dental. °Forms of payment: Cash, credit card, checks. °Best way to get seen: °If not previously registered with the clinic, walk-in dental registration begins at 7:15 am and is on a first come/first serve basis. °If previously registered with the clinic, call to make an appointment. °  °  °[facility] °[phone] °[place] RESIDENTS ONLY °  °Location: °[address], [place], [place] °Clinic Hours: °Mon-Thu 10a-2p °Services: Extractions only! °Payment Options: °FREE (donations accepted) - bring proof of income or support °Best way to get seen: °Call and schedule an appointment OR come at 8am on the 1st Monday of every month (except for holidays) when it is first come/first served. °  °  °[facility] °[phone] °  °Location: °[address], [place] °Clinic Hours: °Friday mornings °Services, Payment Options, Best way to get seen: °Call for info °

## 2017-11-26 ENCOUNTER — Other Ambulatory Visit: Payer: Self-pay

## 2017-11-26 ENCOUNTER — Emergency Department
Admission: EM | Admit: 2017-11-26 | Discharge: 2017-11-26 | Disposition: A | Discharge status: Home / Self Care | Payer: Worker's Compensation | Attending: Emergency Medicine | Admitting: Emergency Medicine

## 2017-11-26 DIAGNOSIS — Z79899 Other long term (current) drug therapy: Secondary | ICD-10-CM | POA: Insufficient documentation

## 2017-11-26 DIAGNOSIS — T754XXA Electrocution, initial encounter: Secondary | ICD-10-CM | POA: Diagnosis not present

## 2017-11-26 DIAGNOSIS — R251 Tremor, unspecified: Secondary | ICD-10-CM | POA: Insufficient documentation

## 2017-11-26 LAB — CBC
HEMATOCRIT: 39.8 % — AB (ref 40.0–52.0)
Hemoglobin: 13.2 g/dL (ref 13.0–18.0)
MCH: 26.2 pg (ref 26.0–34.0)
MCHC: 33.1 g/dL (ref 32.0–36.0)
MCV: 79 fL — AB (ref 80.0–100.0)
PLATELETS: 254 10*3/uL (ref 150–440)
RBC: 5.04 MIL/uL (ref 4.40–5.90)
RDW: 15.7 % — AB (ref 11.5–14.5)
WBC: 6.7 10*3/uL (ref 3.8–10.6)

## 2017-11-26 LAB — COMPREHENSIVE METABOLIC PANEL
ALT: 28 U/L (ref 0–44)
AST: 27 U/L (ref 15–41)
Albumin: 4 g/dL (ref 3.5–5.0)
Alkaline Phosphatase: 59 U/L (ref 38–126)
Anion gap: 9 (ref 5–15)
BILIRUBIN TOTAL: 0.4 mg/dL (ref 0.3–1.2)
BUN: 12 mg/dL (ref 6–20)
CHLORIDE: 103 mmol/L (ref 98–111)
CO2: 26 mmol/L (ref 22–32)
CREATININE: 0.68 mg/dL (ref 0.61–1.24)
Calcium: 9.1 mg/dL (ref 8.9–10.3)
Glucose, Bld: 94 mg/dL (ref 70–99)
POTASSIUM: 4 mmol/L (ref 3.5–5.1)
Sodium: 138 mmol/L (ref 135–145)
TOTAL PROTEIN: 8.6 g/dL — AB (ref 6.5–8.1)

## 2017-11-26 LAB — CK: CK TOTAL: 230 U/L (ref 49–397)

## 2017-11-26 MED ORDER — IBUPROFEN 600 MG PO TABS: 600.0000 mg | ORAL_TABLET | Freq: Four times a day (QID) | ORAL | 0 refills | Status: DC | PRN

## 2017-11-26 MED ORDER — CYCLOBENZAPRINE HCL 5 MG PO TABS: 5.0000 mg | ORAL_TABLET | Freq: Three times a day (TID) | ORAL | 0 refills | Status: DC | PRN

## 2017-11-26 NOTE — ED Provider Notes (Signed)
[facility] Emergency Department Provider Note  Time seen: 1:38 PM  I have reviewed the triage vital signs and the nursing notes.   HISTORY  Chief Complaint Electric Shock    HPI Stephen MesiMarcus Calvin Bautista is a 32 y.o. male with no significant past medical history who presents to the emergency department after a likely electrocution.  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  He reached his hand back behind the machine to attempt to fix it and remembers waking up on the ground next.  Patient states he lifted up the plastic that covers the electrical work of the machine, believes he was likely electrocuted.  States he woke up sitting on the ground does not believe he hit his head.  Denies any pain at this time but states he feels somewhat shaky across his body especially in his right arm.   Past Medical History:  Diagnosis Date  . Skull fracture (HCC)     There are no active problems to display for this patient.   No past surgical history on file.  Prior to Admission medications   Medication Sig Start Date End Date Taking? Authorizing Provider  dicyclomine (BENTYL) 20 MG tablet Take 1 tablet (20 mg total) by mouth 3 (three) times daily as needed for spasms. 07/30/16   Emily FilbertWilliams, Jonathan E, MD  ondansetron (ZOFRAN ODT) 4 MG disintegrating tablet Take 1 tablet (4 mg total) by mouth every 8 (eight) hours as needed for nausea or vomiting. 07/30/16   Emily FilbertWilliams, Jonathan E, MD    No Known Allergies  No family history on file.  Social History Social History   Tobacco Use  . Smoking status: Never Smoker  . Smokeless tobacco: Never Used  Substance Use Topics  . Alcohol use: No    Alcohol/week: 0.0 standard drinks  . Drug use: No    Review of Systems Constitutional: Possible brief LOC. Eyes: Negative for visual complaints ENT: Negative for recent illness/congestion Cardiovascular: Negative for chest pain. Respiratory:  Negative for shortness of breath. Gastrointestinal: Negative for abdominal pain, vomiting Musculoskeletal: Feels shaky and tingly through body. Skin: Negative for skin complaints  Neurological: Negative for headache.  Tingling throughout body. All other ROS negative  ____________________________________________   PHYSICAL EXAM:  VITAL SIGNS: ED Triage Vitals [11/26/17 1307]  Enc Vitals Group     BP (!) 157/100     Pulse Rate 85     Resp 18     Temp 98 F (36.7 C)     Temp Source Oral     SpO2 96 %     Weight 272 lb (123.4 kg)     Height 5\' 9"  (1.753 m)     Head Circumference      Peak Flow      Pain Score 8     Pain Loc      Pain Edu?      Excl. in GC?    Constitutional: Alert and oriented. Well appearing and in no distress. Eyes: Normal exam ENT   Head: Normocephalic and atraumatic.   Mouth/Throat: Mucous membranes are moist. Cardiovascular: Normal rate, regular rhythm. No murmur Respiratory: Normal respiratory effort without tachypnea nor retractions. Breath sounds are clear Gastrointestinal: Soft and nontender. No distention. Musculoskeletal: Nontender with normal range of motion in all extremities.  No signs of compartment syndrome, burn, logical entrance/exit wound in the upper extremities. Neurologic:  Normal speech and language. No gross focal neurologic deficits Skin:  Skin  is warm, dry and intact.  Psychiatric: Mood and affect are normal.   ____________________________________________    EKG  EKG reviewed and interpreted by myself shows normal sinus rhythm at 97 bpm with a narrow QRS, normal axis, normal intervals, nonspecific ST changes without ST elevation.  ____________________________________________   INITIAL IMPRESSION / ASSESSMENT AND PLAN / ED COURSE  Pertinent labs & imaging results that were available during my care of the patient were reviewed by me and considered in my medical decision making (see chart for details).  Patient  presents to the emergency department after alleged use and possible brief LOC.  States this occurred at approximately 11 AM this morning.  Patient is nearly symptom-free besides feeling of tingling and shakiness throughout his body.  We will check labs including a total CK.  We will treat with Norco for the patient's reported back pain and continue to closely monitor.  Overall the patient appears very well.  EKG is reassuring.  Labs are reassuring including CK level.  I discussed with the patient Flexeril as needed for back discomfort, Tylenol or ibuprofen for pain and significant hydration over the next 2 days.  Patient agreeable to plan of care.  ____________________________________________   FINAL CLINICAL IMPRESSION(S) / ED DIAGNOSES  Electrocution injury   Minna Antis, MD 11/26/17 1445

## 2017-11-26 NOTE — ED Triage Notes (Addendum)
Pt reports that he touched an exposed wire at work around 1140, states he is unsure if he passed out but remembers waking up on the floor hurting all over, states that he felt sob and was very weak initially took him a few minutes to gather strength to stand. Pt states bilat forearm to his fingers tips area feels numb. Pt reports pain in the center of his back

## 2017-11-26 NOTE — ED Notes (Signed)
Pt presents at bedside with supervisor HR manager, aunt and sister. HR manager asking questions about care and the Workers compensation information (test). Questions were directed to the EDP. RN will monitor.

## 2017-11-26 NOTE — ED Notes (Signed)
.   Pt is resting, Respirations even and unlabored, NAD. Stretcher lowest postion and locked. Call bell within reach. Denies any needs at this time RN will continue to monitor.    

## 2017-11-26 NOTE — ED Notes (Signed)
Completed Urine Drug screen collection at the "post accident" request of pt's employer rep, Belinda Journigan "Dry Process Manager". Single urine specimen bagged, required paperwork included, hand carried to lab for testing.

## 2017-11-28 ENCOUNTER — Emergency Department: Payer: Worker's Compensation

## 2017-11-28 ENCOUNTER — Encounter: Payer: Self-pay | Admitting: Emergency Medicine

## 2017-11-28 ENCOUNTER — Emergency Department
Admission: EM | Admit: 2017-11-28 | Discharge: 2017-11-28 | Disposition: A | Discharge status: Home / Self Care | Payer: Worker's Compensation | Attending: Emergency Medicine | Admitting: Emergency Medicine

## 2017-11-28 ENCOUNTER — Other Ambulatory Visit: Payer: Self-pay

## 2017-11-28 DIAGNOSIS — M546 Pain in thoracic spine: Secondary | ICD-10-CM | POA: Insufficient documentation

## 2017-11-28 DIAGNOSIS — R202 Paresthesia of skin: Secondary | ICD-10-CM | POA: Insufficient documentation

## 2017-11-28 DIAGNOSIS — Z79899 Other long term (current) drug therapy: Secondary | ICD-10-CM | POA: Diagnosis not present

## 2017-11-28 MED ORDER — HYDROCODONE-ACETAMINOPHEN 5-325 MG PO TABS: 1.0000 | ORAL_TABLET | ORAL | 0 refills | Status: DC | PRN

## 2017-11-28 MED ORDER — MORPHINE SULFATE (PF) 4 MG/ML IV SOLN
4.0000 mg | Freq: Once | INTRAVENOUS | Status: AC
  Administered 2017-11-28: 4 mg via INTRAMUSCULAR

## 2017-11-28 MED ORDER — MORPHINE SULFATE (PF) 4 MG/ML IV SOLN
INTRAVENOUS | Status: AC
  Filled 2017-11-28: qty 1

## 2017-11-28 NOTE — ED Notes (Signed)
Cup of water and blanket provided to pt.

## 2017-11-28 NOTE — ED Provider Notes (Signed)
[facility] Emergency Department Provider Note  Time seen: 2:20 PM  I have reviewed the triage vital signs and the nursing notes.   HISTORY  Chief Complaint Back Pain and Numbness (hands bilaterally)    HPI Stephen Bautista is a 32 y.o. male with no past medical history who presents to the emergency department for back pain and difficulty sleeping due to the back pain.  According to the patient he was at work Friday and suffered an electrocution injury.  Possibly passed out possibly fell backwards although he states he awoke, slumped over on the ground.  I personally saw the patient on Friday in the emergency department, labs are reassuring including CK.  Patient states he is continued to have mid to upper central back pain ever since the incident.  States the pain and soreness has increased and is now up to his neck and occasionally has pains in his arms.  He stated on Friday when I saw him in the emergency department he was having tingling sensations in the arms which she related to the electrocution event.  Currently the patient appears well.  Describes his pain as a constant 6 out of 10 in the back, states it will shoot up to a 10 out of 10 with certain movements such as twisting or standing/sitting up.   Past Medical History:  Diagnosis Date  . Skull fracture (HCC)     There are no active problems to display for this patient.   History reviewed. No pertinent surgical history.  Prior to Admission medications   Medication Sig Start Date End Date Taking? Authorizing Provider  cyclobenzaprine (FLEXERIL) 5 MG tablet Take 1 tablet (5 mg total) by mouth 3 (three) times daily as needed for muscle spasms. 11/26/17   Minna Antis, MD  dicyclomine (BENTYL) 20 MG tablet Take 1 tablet (20 mg total) by mouth 3 (three) times daily as needed for spasms. Patient not taking: Reported on 11/26/2017 07/30/16   Emily Filbert, MD  ibuprofen (ADVIL,MOTRIN) 600 MG  tablet Take 1 tablet (600 mg total) by mouth every 6 (six) hours as needed. 11/26/17   Minna Antis, MD  ondansetron (ZOFRAN ODT) 4 MG disintegrating tablet Take 1 tablet (4 mg total) by mouth every 8 (eight) hours as needed for nausea or vomiting. Patient not taking: Reported on 11/26/2017 07/30/16   Emily Filbert, MD    No Known Allergies  No family history on file.  Social History Social History   Tobacco Use  . Smoking status: Never Smoker  . Smokeless tobacco: Never Used  Substance Use Topics  . Alcohol use: No    Alcohol/week: 0.0 standard drinks  . Drug use: No    Review of Systems Constitutional: Negative for fever. Cardiovascular: Negative for chest pain. Respiratory: Negative for shortness of breath. Gastrointestinal: Negative for abdominal pain Genitourinary: Negative for urinary compaints Musculoskeletal: 6/10 mid upper back pain Skin: Negative for skin complaints  Neurological: Negative for headache.  Occasional tingling in upper extremities. All other ROS negative  ____________________________________________   PHYSICAL EXAM:  VITAL SIGNS: ED Triage Vitals  Enc Vitals Group     BP 11/28/17 1141 (!) 159/111     Pulse Rate 11/28/17 1140 95     Resp 11/28/17 1140 18     Temp 11/28/17 1140 98.4 F (36.9 C)     Temp Source 11/28/17 1140 Oral     SpO2 11/28/17 1140 100 %     Weight 11/28/17 1141 272 lb (  123.4 kg)     Height 11/28/17 1141 5\' 9"  (1.753 m)     Head Circumference --      Peak Flow --      Pain Score 11/28/17 1141 6     Pain Loc --      Pain Edu? --      Excl. in GC? --     Constitutional: Alert and oriented. Well appearing and in no distress. Eyes: Normal exam ENT   Head: Normocephalic and atraumatic.   Mouth/Throat: Mucous membranes are moist. Cardiovascular: Normal rate, regular rhythm. No murmur Respiratory: Normal respiratory effort without tachypnea nor retractions. Breath sounds are clear  Gastrointestinal: Soft  and nontender. No distention.   Musculoskeletal: Nontender with normal range of motion in all extremities.  Normal-appearing upper extremities, great grip strengths.  Soft, no concern of compartment syndrome.  No visible areas concerning for burn.  Moderate mid thoracic tenderness to palpation more so in the paraspinal muscles than centrally.  No C-spine tenderness. Neurologic:  Normal speech and language. No gross focal neurologic deficits.  Equal grip strength bilaterally.  No pronator drift. Skin:  Skin is warm, dry and intact.  Psychiatric: Mood and affect are normal. Speech and behavior are normal.   ____________________________________________   RADIOLOGY  Thoracic x-ray negative  ____________________________________________   INITIAL IMPRESSION / ASSESSMENT AND PLAN / ED COURSE  Pertinent labs & imaging results that were available during my care of the patient were reviewed by me and considered in my medical decision making (see chart for details).  Patient presents to the emergency department for central mid to upper back pain.  Differential would include muscle strain, traumatic injury.  Will obtain x-ray imaging as a precaution.  We will dose pain medication while awaiting imaging results.  Patient agreeable to plan of care.  Thoracic x-ray negative.  Patient states improvement after pain medication.  Highly suspect muscular strain.  We will discharge the patient with a short course of pain medication.  I did discuss with the patient I can write him a note for the next day or 2 off work however if he is desiring FMLA paperwork or paid time off he needs to see his primary care doctor tomorrow.  Patient agreeable to plan of care.  ____________________________________________   FINAL CLINICAL IMPRESSION(S) / ED DIAGNOSES  Back pain Muscular pain   Minna AntisPaduchowski, Lilas Diefendorf, MD 11/28/17 1516

## 2017-11-28 NOTE — ED Notes (Signed)
Patient transported to X-ray 

## 2017-11-28 NOTE — ED Triage Notes (Signed)
Tingling in hands bilaterally, and pain in back up to his neck, and tightness in his shoulders, was seen here Friday after touching an exposed wire at work. Appears in NAD.

## 2017-11-28 NOTE — ED Notes (Signed)
Workers Comp case from Friday.

## 2018-01-06 ENCOUNTER — Emergency Department
Admission: EM | Admit: 2018-01-06 | Discharge: 2018-01-06 | Disposition: A | Discharge status: Home / Self Care | Payer: PRIVATE HEALTH INSURANCE | Attending: Emergency Medicine | Admitting: Emergency Medicine

## 2018-01-06 ENCOUNTER — Other Ambulatory Visit: Payer: Self-pay

## 2018-01-06 ENCOUNTER — Encounter: Payer: Self-pay | Admitting: Emergency Medicine

## 2018-01-06 DIAGNOSIS — I10 Essential (primary) hypertension: Secondary | ICD-10-CM | POA: Insufficient documentation

## 2018-01-06 DIAGNOSIS — Z79899 Other long term (current) drug therapy: Secondary | ICD-10-CM | POA: Insufficient documentation

## 2018-01-06 DIAGNOSIS — G44209 Tension-type headache, unspecified, not intractable: Secondary | ICD-10-CM

## 2018-01-06 HISTORY — DX: Essential (primary) hypertension: I10

## 2018-01-06 MED ORDER — DIPHENHYDRAMINE HCL 50 MG/ML IJ SOLN
12.5000 mg | Freq: Once | INTRAMUSCULAR | Status: AC
  Administered 2018-01-06: 12.5 mg via INTRAVENOUS
  Filled 2018-01-06: qty 1

## 2018-01-06 MED ORDER — DIAZEPAM 2 MG PO TABS: 2.0000 mg | ORAL_TABLET | Freq: Three times a day (TID) | ORAL | 0 refills | Status: DC | PRN

## 2018-01-06 MED ORDER — DIAZEPAM 2 MG PO TABS
2.0000 mg | ORAL_TABLET | Freq: Once | ORAL | Status: AC
  Administered 2018-01-06: 2 mg via ORAL
  Filled 2018-01-06: qty 1

## 2018-01-06 MED ORDER — KETOROLAC TROMETHAMINE 30 MG/ML IJ SOLN
15.0000 mg | Freq: Once | INTRAMUSCULAR | Status: AC
  Administered 2018-01-06: 15 mg via INTRAVENOUS
  Filled 2018-01-06: qty 1

## 2018-01-06 MED ORDER — DEXTROSE-NACL 5-0.45 % IV SOLN
INTRAVENOUS | Status: DC
  Administered 2018-01-06: 04:00:00 via INTRAVENOUS

## 2018-01-06 NOTE — ED Notes (Signed)
Pt states that he has had a headache for the last couple of days that has been persistent and makes him unable to sleep at night. Pt AxOx4.

## 2018-01-06 NOTE — ED Provider Notes (Signed)
[facility] Emergency Department Provider Note   ____________________________________________   First MD Initiated Contact with Patient 01/06/18 [phone]     (approximate)  I have reviewed the triage vital signs and the nursing notes.   HISTORY  Chief Complaint Headache    HPI Stephen Bautista is a 32 y.o. male who presents to the ED from home with a chief complaint of headache.  Patient has a history of headaches when his blood pressure is elevated.  Reports gradual onset of headache while at work yesterday at 1 PM.  Describes headache to the right side of his head associated with photophobia.  No associated fever, chills, chest pain, shortness of breath, abdominal pain, nausea or vomiting.  Had a thoracic back injury approximately 1 month ago for which he is still taking muscle relaxer.  Thought his headache was due to high blood pressure so he took an extra amlodipine approximately 2.5 hours ago.  Denies recent travel or trauma.  Denies slurred speech, altered mentation, extremity weakness, numbness or tingling.   Past Medical History:  Diagnosis Date  . Hypertension   . Skull fracture (HCC)     There are no active problems to display for this patient.   History reviewed. No pertinent surgical history.  Prior to Admission medications   Medication Sig Start Date End Date Taking? Authorizing Provider  cyclobenzaprine (FLEXERIL) 5 MG tablet Take 1 tablet (5 mg total) by mouth 3 (three) times daily as needed for muscle spasms. 11/26/17   Minna AntisPaduchowski, Kevin, MD  diazepam (VALIUM) 2 MG tablet Take 1 tablet (2 mg total) by mouth every 8 (eight) hours as needed for muscle spasms. 01/06/18   Irean HongSung, Juquan Reznick J, MD  dicyclomine (BENTYL) 20 MG tablet Take 1 tablet (20 mg total) by mouth 3 (three) times daily as needed for spasms. Patient not taking: Reported on 11/26/2017 07/30/16   Emily FilbertWilliams, Jonathan E, MD  HYDROcodone-acetaminophen (NORCO/VICODIN) 5-325 MG tablet Take  1 tablet by mouth every 4 (four) hours as needed. 11/28/17   Minna AntisPaduchowski, Kevin, MD  ibuprofen (ADVIL,MOTRIN) 600 MG tablet Take 1 tablet (600 mg total) by mouth every 6 (six) hours as needed. 11/26/17   Minna AntisPaduchowski, Kevin, MD  ondansetron (ZOFRAN ODT) 4 MG disintegrating tablet Take 1 tablet (4 mg total) by mouth every 8 (eight) hours as needed for nausea or vomiting. Patient not taking: Reported on 11/26/2017 07/30/16   Emily FilbertWilliams, Jonathan E, MD    Allergies Patient has no known allergies.  Family History Sister with migraine headaches  Social History Social History   Tobacco Use  . Smoking status: Never Smoker  . Smokeless tobacco: Never Used  Substance Use Topics  . Alcohol use: No    Alcohol/week: 0.0 standard drinks  . Drug use: No    Review of Systems  Constitutional: No fever/chills Eyes: No visual changes. ENT: No sore throat. Cardiovascular: Denies chest pain. Respiratory: Denies shortness of breath. Gastrointestinal: No abdominal pain.  No nausea, no vomiting.  No diarrhea.  No constipation. Genitourinary: Negative for dysuria. Musculoskeletal: Negative for back pain. Skin: Negative for rash. Neurological: Positive for headache.  Negative for focal weakness or numbness.   ____________________________________________   PHYSICAL EXAM:  VITAL SIGNS: ED Triage Vitals  Enc Vitals Group     BP 01/06/18 0317 (!) 146/86     Pulse Rate 01/06/18 0317 96     Resp 01/06/18 0317 18     Temp 01/06/18 0317 98.4 F (36.9 C)  Temp Source 01/06/18 0317 Oral     SpO2 01/06/18 0317 100 %     Weight 01/06/18 0315 290 lb (131.5 kg)     Height 01/06/18 0315 5\' 9"  (1.753 m)     Head Circumference --      Peak Flow --      Pain Score 01/06/18 0314 10     Pain Loc --      Pain Edu? --      Excl. in GC? --     Constitutional: Alert and oriented. Well appearing and in mild acute distress. Eyes: Conjunctivae are normal. PERRL. EOMI. Head: Atraumatic. Nose: No  congestion/rhinnorhea. Mouth/Throat: Mucous membranes are moist.  Oropharynx non-erythematous. Neck: No stridor.  Supple neck without meningismus.  No carotid bruits.  Right trapezius muscle spasms. Cardiovascular: Normal rate, regular rhythm. Grossly normal heart sounds.  Good peripheral circulation. Respiratory: Normal respiratory effort.  No retractions. Lungs CTAB. Gastrointestinal: Soft and nontender. No distention. No abdominal bruits. No CVA tenderness. Musculoskeletal: No lower extremity tenderness nor edema.  No joint effusions. Neurologic: Alert and oriented x3.  CN II to XII grossly intact.  Normal speech and language. No gross focal neurologic deficits are appreciated. No gait instability. Skin:  Skin is warm, dry and intact. No rash noted.  No vesicles or petechiae. Psychiatric: Mood and affect are normal. Speech and behavior are normal.  ____________________________________________   LABS (all labs ordered are listed, but only abnormal results are displayed)  Labs Reviewed - No data to display ____________________________________________  EKG  None ____________________________________________  RADIOLOGY  ED MD interpretation: None  Official radiology report(s): No results found.  ____________________________________________   PROCEDURES  Procedure(s) performed: None  Procedures  Critical Care performed: No  ____________________________________________   INITIAL IMPRESSION / ASSESSMENT AND PLAN / ED COURSE  As part of my medical decision making, I reviewed the following data within the electronic MEDICAL RECORD NUMBER History obtained from family, Nursing notes reviewed and incorporated, Old chart reviewed and Notes from prior ED visits   32 year old male who presents with right-sided headache for 12 hours. Differential diagnosis includes, but is not limited to, intracranial hemorrhage, meningitis/encephalitis, previous head trauma, cavernous venous  thrombosis, tension headache, temporal arteritis, migraine or migraine equivalent, idiopathic intracranial hypertension, and non-specific headache.  Patient took an extra amlodipine approximately 2.5 hours ago so his blood pressure is currently manageable.  Neck is supple and patient does not exhibit focal neurological deficits.  Will administer 2 mg oral Valium for muscle spasms,  D5 half-normal saline infusion, 15 mg IV Toradol for pain paired with 12.5 mg IV Benadryl for nausea.  Will reassess.  Clinical Course as of Jan 06 500  Thu Jan 06, 2018  0427 Pain down to 4/10.  Patient feels significantly better.  Will discharge home with prescription for oral Valium to use instead of Flexeril for muscle relaxation.  Strict return precautions given.  Patient and spouse verbalize understanding and agree with plan of care.   [JS]    Clinical Course User Index [JS] Irean Hong, MD     ____________________________________________   FINAL CLINICAL IMPRESSION(S) / ED DIAGNOSES  Final diagnoses:  Acute non intractable tension-type headache     ED Discharge Orders         Ordered    diazepam (VALIUM) 2 MG tablet  Every 8 hours PRN     01/06/18 0428           Note:  This document was prepared using Dragon voice  recognition software and may include unintentional dictation errors.    Irean Hong, MD 01/06/18 [phone]

## 2018-01-06 NOTE — ED Triage Notes (Signed)
Patient ambulatory to triage with steady gait, without difficulty or distress noted; pt reports right sided HA x 12; st hx of same with HTN

## 2018-01-06 NOTE — Discharge Instructions (Addendum)
Take Valium (#15) instead of Flexeril as needed for muscle spasms. Return to the ER for worsening symptoms, persistent vomiting, lethargy or other concerns.

## 2018-02-14 ENCOUNTER — Emergency Department
Admission: EM | Admit: 2018-02-14 | Discharge: 2018-02-14 | Disposition: A | Discharge status: Home / Self Care | Payer: 59 | Attending: Emergency Medicine | Admitting: Emergency Medicine

## 2018-02-14 ENCOUNTER — Encounter: Payer: Self-pay | Admitting: Emergency Medicine

## 2018-02-14 DIAGNOSIS — M549 Dorsalgia, unspecified: Secondary | ICD-10-CM | POA: Diagnosis not present

## 2018-02-14 DIAGNOSIS — I10 Essential (primary) hypertension: Secondary | ICD-10-CM | POA: Insufficient documentation

## 2018-02-14 MED ORDER — LIDOCAINE 5 % EX PTCH
1.0000 | MEDICATED_PATCH | CUTANEOUS | Status: DC
  Administered 2018-02-14: 1 via TRANSDERMAL
  Filled 2018-02-14: qty 1

## 2018-02-14 MED ORDER — LIDOCAINE 5 % EX PTCH: 1.0000 | MEDICATED_PATCH | CUTANEOUS | 0 refills | Status: DC

## 2018-02-14 MED ORDER — ORPHENADRINE CITRATE 30 MG/ML IJ SOLN
60.0000 mg | Freq: Two times a day (BID) | INTRAMUSCULAR | Status: DC
  Administered 2018-02-14: 60 mg via INTRAMUSCULAR
  Filled 2018-02-14: qty 2

## 2018-02-14 MED ORDER — OXYCODONE-ACETAMINOPHEN 5-325 MG PO TABS
1.0000 | ORAL_TABLET | Freq: Once | ORAL | Status: AC
  Administered 2018-02-14: 1 via ORAL
  Filled 2018-02-14: qty 1

## 2018-02-14 MED ORDER — KETOROLAC TROMETHAMINE 10 MG PO TABS: 10.0000 mg | ORAL_TABLET | Freq: Four times a day (QID) | ORAL | 0 refills | Status: DC | PRN

## 2018-02-14 MED ORDER — PREDNISONE 10 MG PO TABS: ORAL_TABLET | ORAL | 0 refills | Status: DC

## 2018-02-14 MED ORDER — KETOROLAC TROMETHAMINE 30 MG/ML IJ SOLN
30.0000 mg | Freq: Once | INTRAMUSCULAR | Status: AC
  Administered 2018-02-14: 30 mg via INTRAMUSCULAR
  Filled 2018-02-14: qty 1

## 2018-02-14 MED ORDER — METHYLPREDNISOLONE SODIUM SUCC 125 MG IJ SOLR
125.0000 mg | Freq: Once | INTRAMUSCULAR | Status: AC
  Administered 2018-02-14: 125 mg via INTRAMUSCULAR
  Filled 2018-02-14: qty 2

## 2018-02-14 NOTE — ED Provider Notes (Signed)
[facility] Emergency Department Provider Note  ____________________________________________  Time seen: Approximately 2:41 PM  I have reviewed the triage vital signs and the nursing notes.   HISTORY  Chief Complaint Back Pain    HPI Stephen Bautista is a 32 y.o. male that presents to emergency department for evaluation of continued mid back pain for 2 months.  Patient had an injury at work 2 months ago and has had chronic back pain since.  He takes tramadol daily for pain, which is not helping.  He has been seeing physical therapy and saw Ortho 4 days ago.  Pain has not changed in character today.  He tried to make an appointment with his primary care provider but they were unable to see him today and it could get him in this week.  No new injury.  Pain is in the center of his spine and does not radiate.  Pain is worse with certain movements and with touching his back.  No IV drug use.  No fever, chills, shortness of breath, chest pain.  Past Medical History:  Diagnosis Date  . Hypertension   . Skull fracture (HCC)     There are no active problems to display for this patient.   History reviewed. No pertinent surgical history.  Prior to Admission medications   Medication Sig Start Date End Date Taking? Authorizing Provider  cyclobenzaprine (FLEXERIL) 5 MG tablet Take 1 tablet (5 mg total) by mouth 3 (three) times daily as needed for muscle spasms. 11/26/17   Minna Antis, MD  diazepam (VALIUM) 2 MG tablet Take 1 tablet (2 mg total) by mouth every 8 (eight) hours as needed for muscle spasms. 01/06/18   Irean Hong, MD  dicyclomine (BENTYL) 20 MG tablet Take 1 tablet (20 mg total) by mouth 3 (three) times daily as needed for spasms. Patient not taking: Reported on 11/26/2017 07/30/16   Emily Filbert, MD  HYDROcodone-acetaminophen (NORCO/VICODIN) 5-325 MG tablet Take 1 tablet by mouth every 4 (four) hours as needed. 11/28/17   Minna Antis, MD   ibuprofen (ADVIL,MOTRIN) 600 MG tablet Take 1 tablet (600 mg total) by mouth every 6 (six) hours as needed. 11/26/17   Minna Antis, MD  ketorolac (TORADOL) 10 MG tablet Take 1 tablet (10 mg total) by mouth every 6 (six) hours as needed. 02/14/18   Enid Derry, PA-C  lidocaine (LIDODERM) 5 % Place 1 patch onto the skin daily. Remove & Discard patch within 12 hours or as directed by MD 02/14/18   Enid Derry, PA-C  ondansetron (ZOFRAN ODT) 4 MG disintegrating tablet Take 1 tablet (4 mg total) by mouth every 8 (eight) hours as needed for nausea or vomiting. Patient not taking: Reported on 11/26/2017 07/30/16   Emily Filbert, MD  predniSONE (DELTASONE) 10 MG tablet Take 6 tablets on day 1, take 5 tablets on day 2, take 4 tablets on day 3, take 3 tablets on day 4, take 2 tablets on day 5, take 1 tablet on day 6 02/14/18   Enid Derry, PA-C    Allergies Patient has no known allergies.  History reviewed. No pertinent family history.  Social History Social History   Tobacco Use  . Smoking status: Never Smoker  . Smokeless tobacco: Never Used  Substance Use Topics  . Alcohol use: No    Alcohol/week: 0.0 standard drinks  . Drug use: No     Review of Systems  Constitutional: No fever/chills Cardiovascular: No chest pain. Respiratory: No SOB.  Gastrointestinal: No nausea, no vomiting.  Musculoskeletal: Positive for back pain. Skin: Negative for rash, abrasions, lacerations, ecchymosis. Neurological: Negative for headaches, numbness or tingling   ____________________________________________   PHYSICAL EXAM:  VITAL SIGNS: ED Triage Vitals [02/14/18 1347]  Enc Vitals Group     BP (!) 156/98     Pulse Rate (!) 111     Resp 18     Temp 98.6 F (37 C)     Temp Source Oral     SpO2 98 %     Weight 288 lb 12.8 oz (131 kg)     Height      Head Circumference      Peak Flow      Pain Score      Pain Loc      Pain Edu?      Excl. in GC?      Constitutional:  Alert and oriented. Well appearing and in no acute distress. Eyes: Conjunctivae are normal. PERRL. EOMI. Head: Atraumatic. ENT:      Ears:      Nose: No congestion/rhinnorhea.      Mouth/Throat: Mucous membranes are moist.  Neck: No stridor.  No cervical spine tenderness to palpation. Cardiovascular: Normal rate, regular rhythm.  Good peripheral circulation. Respiratory: Normal respiratory effort without tachypnea or retractions. Lungs CTAB. Good air entry to the bases with no decreased or absent breath sounds. Musculoskeletal: Full range of motion to all extremities. No gross deformities appreciated.  Tenderness to palpation over thoracic spine.  Pain elicited with rotation of spine.  Normal gait. Neurologic:  Normal speech and language. No gross focal neurologic deficits are appreciated.  Skin:  Skin is warm, dry and intact. No rash noted. Psychiatric: Mood and affect are normal. Speech and behavior are normal. Patient exhibits appropriate insight and judgement.   ____________________________________________   LABS (all labs ordered are listed, but only abnormal results are displayed)  Labs Reviewed - No data to display ____________________________________________  EKG   ____________________________________________  RADIOLOGY   No results found.  ____________________________________________    PROCEDURES  Procedure(s) performed:    Procedures    Medications  orphenadrine (NORFLEX) injection 60 mg (60 mg Intramuscular Given 02/14/18 1507)  lidocaine (LIDODERM) 5 % 1 patch (1 patch Transdermal Patch Applied 02/14/18 1509)  oxyCODONE-acetaminophen (PERCOCET/ROXICET) 5-325 MG per tablet 1 tablet (1 tablet Oral Given 02/14/18 1506)  methylPREDNISolone sodium succinate (SOLU-MEDROL) 125 mg/2 mL injection 125 mg (125 mg Intramuscular Given 02/14/18 1507)  ketorolac (TORADOL) 30 MG/ML injection 30 mg (30 mg Intramuscular Given 02/14/18 1506)      ____________________________________________   INITIAL IMPRESSION / ASSESSMENT AND PLAN / ED COURSE  Pertinent labs & imaging results that were available during my care of the patient were reviewed by me and considered in my medical decision making (see chart for details).  Review of the [place] CSRS was performed in accordance of the [facility] prior to dispensing any controlled drugs.     Patient presented the emergency department for evaluation of mid back pain for 2 months.  Patient is following physical therapy and Ortho for this.  He will follow-up with primary care this week.  Symptoms have not changed in character.  IM Solu-Medrol, Toradol, Norflex were given.  Oral Percocet was given.  He will continue his tramadol at home.  He also has a prescription for Flexeril at home.  Patient will be discharged home with prescriptions for prednisone, Toradol, Lidoderm. Patient is to follow up with primary care and Ortho  as directed. Patient is given ED precautions to return to the ED for any worsening or new symptoms.     ____________________________________________  FINAL CLINICAL IMPRESSION(S) / ED DIAGNOSES  Final diagnoses:  Mid back pain      NEW MEDICATIONS STARTED DURING THIS VISIT:  ED Discharge Orders         Ordered    predniSONE (DELTASONE) 10 MG tablet     02/14/18 1517    ketorolac (TORADOL) 10 MG tablet  Every 6 hours PRN     02/14/18 1517    lidocaine (LIDODERM) 5 %  Every 24 hours     02/14/18 1517              This chart was dictated using voice recognition software/Dragon. Despite best efforts to proofread, errors can occur which can change the meaning. Any change was purely unintentional.    Enid Derry, PA-C 02/14/18 1612    Emily Filbert, MD 02/15/18 [phone]

## 2018-02-14 NOTE — ED Triage Notes (Signed)
Pt reports he has a workers comp claim since August 9th when he injured thoracic area of spine and is to ED today due to increase and unmanaged pain.

## 2018-03-16 ENCOUNTER — Emergency Department: Payer: 59

## 2018-03-16 ENCOUNTER — Other Ambulatory Visit: Payer: Self-pay

## 2018-03-16 ENCOUNTER — Encounter: Payer: Self-pay | Admitting: Emergency Medicine

## 2018-03-16 ENCOUNTER — Emergency Department
Admission: EM | Admit: 2018-03-16 | Discharge: 2018-03-16 | Disposition: A | Discharge status: Home / Self Care | Payer: 59 | Attending: Emergency Medicine | Admitting: Emergency Medicine

## 2018-03-16 DIAGNOSIS — J208 Acute bronchitis due to other specified organisms: Secondary | ICD-10-CM

## 2018-03-16 DIAGNOSIS — R05 Cough: Secondary | ICD-10-CM | POA: Diagnosis present

## 2018-03-16 DIAGNOSIS — I10 Essential (primary) hypertension: Secondary | ICD-10-CM | POA: Insufficient documentation

## 2018-03-16 MED ORDER — HYDROCOD POLST-CPM POLST ER 10-8 MG/5ML PO SUER
5.0000 mL | ORAL | Status: AC
  Administered 2018-03-16: 5 mL via ORAL
  Filled 2018-03-16: qty 5

## 2018-03-16 MED ORDER — HYDROCODONE-CHLORPHENIRAMINE 5-4 MG/5ML PO SOLN: 5.0000 mL | Freq: Four times a day (QID) | ORAL | 0 refills | Status: DC | PRN

## 2018-03-16 MED ORDER — AZITHROMYCIN 250 MG PO TABS: ORAL_TABLET | ORAL | 0 refills | Status: DC

## 2018-03-16 MED ORDER — BENZONATATE 100 MG PO CAPS: 100.0000 mg | ORAL_CAPSULE | Freq: Three times a day (TID) | ORAL | 0 refills | Status: DC | PRN

## 2018-03-16 NOTE — ED Notes (Signed)
Patient transported to X-ray 

## 2018-03-16 NOTE — ED Triage Notes (Addendum)
Patient ambulatory to triage with steady gait, without difficulty or distress noted; pt reports prod cough "orange" sputum and hurts to take a deep breath; denies fever

## 2018-03-16 NOTE — Discharge Instructions (Signed)
You have been seen in the Emergency Department (ED) today for a likely viral illness.  Please drink plenty of clear fluids (water, Gatorade, chicken broth, etc).  You may use Tylenol and/or Motrin according to label instructions.  You can alternate between the two without any side effects.   Use the cough syrup prescribed as needed for nighttime cough, but do not take it during the day because it can make you sleepy and impair your ability to drive and operate machinery.  Please follow up with your doctor as listed above.  Call your doctor or return to the Emergency Department (ED) if you are unable to tolerate fluids due to vomiting, have worsening trouble breathing, become extremely tired or difficult to awaken, or if you develop any other symptoms that concern you.

## 2018-03-16 NOTE — ED Provider Notes (Signed)
[facility] Emergency Department Provider Note  ____________________________________________   First MD Initiated Contact with Patient 03/16/18 [phone]     (approximate)  I have reviewed the triage vital signs and the nursing notes.   HISTORY  Chief Complaint Cough    HPI Stephen Bautista is a 32 y.o. male with no contributory past medical history who presents for evaluation of a persistent cough.  The cough has been present for a couple of days.  He is tired of coughing and producing thick sputum that is occasionally orange, but he denies chest pain and shortness of breath.  He has had no fevers, chills, myalgias, nausea, vomiting, nor abdominal pain.  Exertion makes his symptoms worse and rest makes it a little bit better although when he is trying to sleep at night is when the cough is the worst.  He has had no recent sick contacts.  No nasal congestion, runny nose, sore throat, earache, neck stiffness, nor neck pain.  He describes his cough as severe.  He has no smoking history.  Past Medical History:  Diagnosis Date  . Hypertension   . Skull fracture (HCC)     There are no active problems to display for this patient.   History reviewed. No pertinent surgical history.  Prior to Admission medications   Medication Sig Start Date End Date Taking? Authorizing Provider  azithromycin (ZITHROMAX) 250 MG tablet Take 2 tablets PO on day 1, then take 1 tablet PO daily for 4 more days 03/16/18   Loleta Rose, MD  benzonatate (TESSALON PERLES) 100 MG capsule Take 1 capsule (100 mg total) by mouth 3 (three) times daily as needed for cough. 03/16/18   Loleta Rose, MD  cyclobenzaprine (FLEXERIL) 5 MG tablet Take 1 tablet (5 mg total) by mouth 3 (three) times daily as needed for muscle spasms. 11/26/17   Minna Antis, MD  diazepam (VALIUM) 2 MG tablet Take 1 tablet (2 mg total) by mouth every 8 (eight) hours as needed for muscle spasms. 01/06/18   Irean Hong, MD  dicyclomine (BENTYL) 20 MG tablet Take 1 tablet (20 mg total) by mouth 3 (three) times daily as needed for spasms. Patient not taking: Reported on 11/26/2017 07/30/16   Emily Filbert, MD  HYDROcodone-acetaminophen (NORCO/VICODIN) 5-325 MG tablet Take 1 tablet by mouth every 4 (four) hours as needed. 11/28/17   Minna Antis, MD  HYDROcodone-Chlorpheniramine 5-4 MG/5ML SOLN Take 5 mLs by mouth every 6 (six) hours as needed. 03/16/18   Loleta Rose, MD  ibuprofen (ADVIL,MOTRIN) 600 MG tablet Take 1 tablet (600 mg total) by mouth every 6 (six) hours as needed. 11/26/17   Minna Antis, MD  ketorolac (TORADOL) 10 MG tablet Take 1 tablet (10 mg total) by mouth every 6 (six) hours as needed. 02/14/18   Enid Derry, PA-C  lidocaine (LIDODERM) 5 % Place 1 patch onto the skin daily. Remove & Discard patch within 12 hours or as directed by MD 02/14/18   Enid Derry, PA-C  ondansetron (ZOFRAN ODT) 4 MG disintegrating tablet Take 1 tablet (4 mg total) by mouth every 8 (eight) hours as needed for nausea or vomiting. Patient not taking: Reported on 11/26/2017 07/30/16   Emily Filbert, MD  predniSONE (DELTASONE) 10 MG tablet Take 6 tablets on day 1, take 5 tablets on day 2, take 4 tablets on day 3, take 3 tablets on day 4, take 2 tablets on day 5, take 1 tablet on day 6 02/14/18  Enid DerryWagner, Ashley, PA-C    Allergies Patient has no known allergies.  No family history on file.  Social History Social History   Tobacco Use  . Smoking status: Never Smoker  . Smokeless tobacco: Never Used  Substance Use Topics  . Alcohol use: No    Alcohol/week: 0.0 standard drinks  . Drug use: No    Review of Systems Constitutional: No fever/chills Eyes: No visual changes. ENT: No sore throat. Cardiovascular: Denies chest pain. Respiratory: Productive cough.  Denies shortness of breath. Gastrointestinal: No abdominal pain.  No nausea, no vomiting.  No diarrhea.  No  constipation. Genitourinary: Negative for dysuria. Musculoskeletal: Negative for neck pain.  Negative for back pain. Integumentary: Negative for rash. Neurological: Negative for headaches, focal weakness or numbness.   ____________________________________________   PHYSICAL EXAM:  VITAL SIGNS: ED Triage Vitals  Enc Vitals Group     BP 03/16/18 0025 (!) 168/100     Pulse Rate 03/16/18 0025 (!) 110     Resp 03/16/18 0025 18     Temp 03/16/18 0025 97.6 F (36.4 C)     Temp Source 03/16/18 0025 Oral     SpO2 03/16/18 0025 95 %     Weight 03/16/18 0024 134.7 kg (297 lb)     Height 03/16/18 0024 1.753 m (5\' 9" )     Head Circumference --      Peak Flow --      Pain Score --      Pain Loc --      Pain Edu? --      Excl. in GC? --     Constitutional: Alert and oriented. Well appearing and in no acute distress. Eyes: Conjunctivae are normal.  Head: Atraumatic. Nose: No congestion/rhinnorhea. Mouth/Throat: Mucous membranes are moist. Neck: No stridor.  No meningeal signs.   Cardiovascular: Normal rate, regular rhythm. Good peripheral circulation. Grossly normal heart sounds. Respiratory: Normal respiratory effort.  No retractions. Lungs CTAB.  Frequent thick cough when taking a deep breath. Gastrointestinal: Soft and nontender. No distention.  Musculoskeletal: No lower extremity tenderness nor edema. No gross deformities of extremities. Neurologic:  Normal speech and language. No gross focal neurologic deficits are appreciated.  Skin:  Skin is warm, dry and intact. No rash noted. Psychiatric: Mood and affect are normal. Speech and behavior are normal.  ____________________________________________   LABS (all labs ordered are listed, but only abnormal results are displayed)  Labs Reviewed - No data to display ____________________________________________  EKG  No indication for EKG ____________________________________________  RADIOLOGY I, Loleta Roseory Soumya Colson, personally  viewed and evaluated these images (plain radiographs) as part of my medical decision making, as well as reviewing the written report by the radiologist.  ED MD interpretation: No evidence of lobar pneumonia  Official radiology report(s): Dg Chest 2 View  Result Date: 03/16/2018 CLINICAL DATA:  Productive cough. EXAM: CHEST - 2 VIEW COMPARISON:  Radiographs and CT 11/26/2014 FINDINGS: Persistent low lung volumes, similar to prior exam.The cardiomediastinal contours are normal. The lungs are clear. Pulmonary vasculature is normal. No consolidation, pleural effusion, or pneumothorax. No acute osseous abnormalities are seen. IMPRESSION: Low lung volumes without acute findings. Electronically Signed   By: Narda RutherfordMelanie  Sanford M.D.   On: 03/16/2018 01:08    ____________________________________________   PROCEDURES  Critical Care performed: No   Procedure(s) performed:   Procedures   ____________________________________________   INITIAL IMPRESSION / ASSESSMENT AND PLAN / ED COURSE  As part of my medical decision making, I reviewed the following data within  the electronic MEDICAL RECORD NUMBER Nursing notes reviewed and incorporated, Radiograph reviewed  and Notes from prior ED visits    The patient looks uncomfortable as with a viral infection but is certainly nontoxic.  He has some mild tachycardia but he is also coughing frequently.  His lung sounds are clear in spite of the cough and his chest x-ray shows no sign of pneumonia.  However, given the nature of the cough with the occasional "orange" sputum which may represent some very mild hemoptysis, I am going to treat him empirically with azithromycin as well as Tessalon and cough syrup.  I gave him my usual and customary management recommendations and return precautions and he states that he understands and agrees with the plan.  No other testing is indicated at this time.  Given his lack of other systemic symptoms I doubt influenza and I would  be unlikely to change management even if he was influenza positive given the reassuring chest x-ray and the lack of evidence supporting the efficacy of Tamiflu.  Clinical Course as of Mar 16 921  Wed Mar 16, 2018  0148 I reviewed the patient's prescription history over the last 24 months in the multi-state controlled substances database(s) that includes [place], [place], [place], [place], [place], [place], [place], [place], [place], [place], [place], [place], [place], [employer], [place], [place], [place], [place], [place], [place], [employer], [place], [place], [place], [place], [place], [place], [place], [place], [place], [place], [place] PMP, and [place].  Results were notable for a few prescriptions over the last few months for Norco.  However he has acute bronchitis with a thick and productive cough and I think he would benefit from some cough syrup as prescribed below.   [CF]    Clinical Course User Index [CF] Loleta Rose, MD    ____________________________________________  FINAL CLINICAL IMPRESSION(S) / ED DIAGNOSES  Final diagnoses:  Acute viral bronchitis     MEDICATIONS GIVEN DURING THIS VISIT:  Medications  chlorpheniramine-HYDROcodone (TUSSIONEX) 10-8 MG/5ML suspension 5 mL (5 mLs Oral Given 03/16/18 0159)     ED Discharge Orders         Ordered    azithromycin (ZITHROMAX) 250 MG tablet     03/16/18 0148    HYDROcodone-Chlorpheniramine 5-4 MG/5ML SOLN  Every 6 hours PRN     03/16/18 0148    benzonatate (TESSALON PERLES) 100 MG capsule  3 times daily PRN     03/16/18 0148           Note:  This document was prepared using Dragon voice recognition software and may include unintentional dictation errors.    Loleta Rose, MD 03/16/18 [phone]

## 2018-04-21 ENCOUNTER — Encounter: Payer: Self-pay | Admitting: *Deleted

## 2018-04-21 ENCOUNTER — Other Ambulatory Visit: Payer: Self-pay

## 2018-04-21 ENCOUNTER — Emergency Department
Admission: EM | Admit: 2018-04-21 | Discharge: 2018-04-21 | Disposition: A | Discharge status: Home / Self Care | Payer: 59 | Attending: Emergency Medicine | Admitting: Emergency Medicine

## 2018-04-21 DIAGNOSIS — M546 Pain in thoracic spine: Secondary | ICD-10-CM | POA: Insufficient documentation

## 2018-04-21 DIAGNOSIS — G8929 Other chronic pain: Secondary | ICD-10-CM | POA: Insufficient documentation

## 2018-04-21 DIAGNOSIS — I10 Essential (primary) hypertension: Secondary | ICD-10-CM | POA: Insufficient documentation

## 2018-04-21 MED ORDER — KETOROLAC TROMETHAMINE 30 MG/ML IJ SOLN
INTRAMUSCULAR | Status: AC
  Administered 2018-04-21: 19:00:00
  Filled 2018-04-21: qty 1

## 2018-04-21 MED ORDER — KETOROLAC TROMETHAMINE 30 MG/ML IJ SOLN
60.0000 mg | Freq: Once | INTRAMUSCULAR | Status: AC
  Administered 2018-04-21: 60 mg via INTRAMUSCULAR

## 2018-04-21 MED ORDER — KETOROLAC TROMETHAMINE 10 MG PO TABS: 10.0000 mg | ORAL_TABLET | Freq: Three times a day (TID) | ORAL | 0 refills | Status: DC

## 2018-04-21 MED ORDER — ORPHENADRINE CITRATE 30 MG/ML IJ SOLN
60.0000 mg | INTRAMUSCULAR | Status: AC
  Administered 2018-04-21: 60 mg via INTRAMUSCULAR
  Filled 2018-04-21: qty 2

## 2018-04-21 NOTE — ED Triage Notes (Signed)
Pt ambulatory to triage.  Pt has chronic back pain.  Pt reports mid back pain.  No recent injury to back.  No urinary sx.  Pt alert.

## 2018-04-21 NOTE — Discharge Instructions (Addendum)
Take the prescription antiinflammatory medicine in lieu of the ibuprofen, until all pills are completed. Follow-up with your provider as planned.

## 2018-04-21 NOTE — ED Provider Notes (Signed)
[facility] Emergency Department Provider Note ____________________________________________  Time seen: [id number]  I have reviewed the triage vital signs and the nursing notes.  HISTORY  Chief Complaint  Back Pain  HPI Stephen Bautista is a 33 y.o. male presents to the ED for evaluation management of acute on chronic thoracic muscle pain.  Patient gives a remote history of an accidental electrocution injury 6 months prior while on the job.  He has been managed by his primary provider in the interim, and is also been evaluated by orthopedics.  He is having physical therapy twice weekly for his continued myalgias and some residual right distal fingertip seizures.  He denies any reinjury, accident, or trauma.  He was told by his primary provider to report to the ED for any pain that was not resolved with his current prescription ahead of his appointment next week.  Patient denies any chest pain, shortness of breath, weakness, or syncope.  Past Medical History:  Diagnosis Date  . Hypertension   . Skull fracture (HCC)     There are no active problems to display for this patient.   History reviewed. No pertinent surgical history.  Prior to Admission medications   Medication Sig Start Date End Date Taking? Authorizing Provider  ketorolac (TORADOL) 10 MG tablet Take 1 tablet (10 mg total) by mouth every 8 (eight) hours. 04/21/18   Zyquan Crotty, Charlesetta IvoryJenise V Bacon, PA-C    Allergies Patient has no known allergies.  No family history on file.  Social History Social History   Tobacco Use  . Smoking status: Never Smoker  . Smokeless tobacco: Never Used  Substance Use Topics  . Alcohol use: No    Alcohol/week: 0.0 standard drinks  . Drug use: No    Review of Systems  Constitutional: Negative for fever. Eyes: Negative for visual changes. ENT: Negative for sore throat. Cardiovascular: Negative for chest pain. Respiratory: Negative for shortness of  breath. Gastrointestinal: Negative for abdominal pain, vomiting and diarrhea. Genitourinary: Negative for dysuria. Musculoskeletal: Active for mid back pain. Skin: Negative for rash. Neurological: Negative for headaches, focal weakness or numbness. ____________________________________________  PHYSICAL EXAM:  VITAL SIGNS: ED Triage Vitals  Enc Vitals Group     BP 04/21/18 1658 (!) 167/84     Pulse Rate 04/21/18 1658 94     Resp 04/21/18 1658 20     Temp 04/21/18 1658 98 F (36.7 C)     Temp Source 04/21/18 1658 Oral     SpO2 04/21/18 1658 100 %     Weight 04/21/18 1655 297 lb (134.7 kg)     Height 04/21/18 1655 5\' 9"  (1.753 m)     Head Circumference --      Peak Flow --      Pain Score 04/21/18 1655 9     Pain Loc --      Pain Edu? --      Excl. in GC? --     Constitutional: Alert and oriented. Well appearing and in no distress. Head: Normocephalic and atraumatic. Eyes: Conjunctivae are normal. Normal extraocular movements Neck: Supple. No thyromegaly. Cardiovascular: Normal rate, regular rhythm. Normal distal pulses. Respiratory: Normal respiratory effort. No wheezes/rales/rhonchi. Gastrointestinal: Soft and nontender. No distention. Musculoskeletal: Normal spinal alignment without midline tenderness, spasm, deformity, or step-off.  Normal upper extremity resistance testing bilaterally.  Nontender with normal range of motion in all extremities.  Neurologic: Cranial nerves II through XII grossly intact.  Normal UE DTRs bilaterally.  Normal gait without ataxia. Normal  speech and language. No gross focal neurologic deficits are appreciated. Skin:  Skin is warm, dry and intact. No rash noted. ____________________________________________  PROCEDURES  Procedures Toradol 60 mg IM Norflex 60 mg IM ____________________________________________  INITIAL IMPRESSION / ASSESSMENT AND PLAN / ED COURSE  Patient with ED evaluation of acute on chronic thoracic muscle strain  secondary to accidental electrocution injury.  Patient exam is overall benign and reassuring at this time.  Symptoms likely represent an acute flare of his myalgias.  He is discharged after IM medication administration.  He reports improvement of his symptoms at this time.  He will be discharged with a prescription for ketorolac to take in lieu of his previous ibuprofen prescription.  He will follow with primary provider as planned.  A work note was provided for 1 day as requested. ____________________________________________  FINAL CLINICAL IMPRESSION(S) / ED DIAGNOSES  Final diagnoses:  Chronic bilateral thoracic back pain      Stephen Bautista, Charlesetta IvoryJenise V Bacon, PA-C 04/21/18 2333    Phineas SemenGoodman, [place], MD 04/22/18 1511

## 2018-04-21 NOTE — ED Notes (Signed)
See triage note  Presents with mid to lower back pain  State he has a hx of chronic back pain and now states pain is increased   Denies any recent injury   Ambulates slowly to treatment room

## 2019-01-18 ENCOUNTER — Encounter: Payer: Self-pay | Admitting: Emergency Medicine

## 2019-01-18 ENCOUNTER — Emergency Department: Payer: Self-pay

## 2019-01-18 ENCOUNTER — Emergency Department
Admission: EM | Admit: 2019-01-18 | Discharge: 2019-01-18 | Disposition: A | Discharge status: Home / Self Care | Payer: Self-pay | Attending: Emergency Medicine | Admitting: Emergency Medicine

## 2019-01-18 ENCOUNTER — Other Ambulatory Visit: Payer: Self-pay

## 2019-01-18 DIAGNOSIS — R000 Tachycardia, unspecified: Secondary | ICD-10-CM | POA: Insufficient documentation

## 2019-01-18 DIAGNOSIS — I10 Essential (primary) hypertension: Secondary | ICD-10-CM | POA: Insufficient documentation

## 2019-01-18 LAB — CBC WITH DIFFERENTIAL/PLATELET
Abs Immature Granulocytes: 0.17 10*3/uL — ABNORMAL HIGH (ref 0.00–0.07)
Basophils Absolute: 0 10*3/uL (ref 0.0–0.1)
Basophils Relative: 0 %
Eosinophils Absolute: 0.3 10*3/uL (ref 0.0–0.5)
Eosinophils Relative: 3 %
HCT: 40.8 % (ref 39.0–52.0)
Hemoglobin: 12.8 g/dL — ABNORMAL LOW (ref 13.0–17.0)
Immature Granulocytes: 2 %
Lymphocytes Relative: 28 %
Lymphs Abs: 2.5 10*3/uL (ref 0.7–4.0)
MCH: 24.2 pg — ABNORMAL LOW (ref 26.0–34.0)
MCHC: 31.4 g/dL (ref 30.0–36.0)
MCV: 77.1 fL — ABNORMAL LOW (ref 80.0–100.0)
Monocytes Absolute: 0.7 10*3/uL (ref 0.1–1.0)
Monocytes Relative: 8 %
Neutro Abs: 5.3 10*3/uL (ref 1.7–7.7)
Neutrophils Relative %: 59 %
Platelets: 313 10*3/uL (ref 150–400)
RBC: 5.29 MIL/uL (ref 4.22–5.81)
RDW: 16.3 % — ABNORMAL HIGH (ref 11.5–15.5)
WBC: 9 10*3/uL (ref 4.0–10.5)
nRBC: 0 % (ref 0.0–0.2)

## 2019-01-18 LAB — COMPREHENSIVE METABOLIC PANEL
ALT: 37 U/L (ref 0–44)
AST: 27 U/L (ref 15–41)
Albumin: 3.8 g/dL (ref 3.5–5.0)
Alkaline Phosphatase: 66 U/L (ref 38–126)
Anion gap: 9 (ref 5–15)
BUN: 10 mg/dL (ref 6–20)
CO2: 26 mmol/L (ref 22–32)
Calcium: 9.1 mg/dL (ref 8.9–10.3)
Chloride: 102 mmol/L (ref 98–111)
Creatinine, Ser: 0.74 mg/dL (ref 0.61–1.24)
GFR calc Af Amer: >60 mL/min (ref >60)
GFR calc non Af Amer: >60 mL/min (ref >60)
Glucose, Bld: 106 mg/dL — ABNORMAL HIGH (ref 70–99)
Potassium: 3.9 mmol/L (ref 3.5–5.1)
Sodium: 137 mmol/L (ref 135–145)
Total Bilirubin: 0.5 mg/dL (ref 0.3–1.2)
Total Protein: 8.7 g/dL — ABNORMAL HIGH (ref 6.5–8.1)

## 2019-01-18 LAB — TSH: TSH: 1.671 u[IU]/mL (ref 0.350–4.500)

## 2019-01-18 LAB — T4, FREE: Free T4: 0.9 ng/dL (ref 0.61–1.12)

## 2019-01-18 MED ORDER — SODIUM CHLORIDE 0.9 % IV BOLUS
1000.0000 mL | Freq: Once | INTRAVENOUS | Status: AC
  Administered 2019-01-18: 1000 mL via INTRAVENOUS

## 2019-01-18 MED ORDER — METOPROLOL TARTRATE 25 MG PO TABS: 25.0000 mg | ORAL_TABLET | Freq: Two times a day (BID) | ORAL | 0 refills | Status: DC

## 2019-01-18 NOTE — ED Triage Notes (Signed)
Pt in via POV, reports being sent her from previous doctors office where he was being seen for his back pain; patient with elevated heart rate in office, advised to be evaluated here.  Pt reports hx of same.  Denies any complaints, denies chest pain, denies shortness of breath.  NAD noted at this time.

## 2019-01-18 NOTE — ED Notes (Signed)
X-ray at bedside

## 2019-01-18 NOTE — ED Provider Notes (Signed)
[facility] Emergency Department Provider Note  ____________________________________________  Time seen: Approximately 11:45 AM  I have reviewed the triage vital signs and the nursing notes.   HISTORY  Chief Complaint Tachycardia    HPI Stephen Bautista is a 33 y.o. male with history of hypertension who comes the ED for evaluation of fast heart rate.  States that his only symptoms are chronic back pain for which he had gone to  [facility]itative [facility] this morning.  When I found his heart rate to be fast they sent him to the ED for evaluation.  He denies any chest pain shortness of breath or exertional symptoms.  No weight changes fevers chills sweats cough vomiting diarrhea or other concerns.  Normal oral intake, as far as the patient is concerned he is in his baseline state of health.     Past Medical History:  Diagnosis Date  . Hypertension   . Skull fracture (HCC)      There are no active problems to display for this patient.    History reviewed. No pertinent surgical history.   Prior to Admission medications   Medication Sig Start Date End Date Taking? Authorizing Provider  ketorolac (TORADOL) 10 MG tablet Take 1 tablet (10 mg total) by mouth every 8 (eight) hours. 04/21/18   Menshew, Dannielle Karvonen, PA-C  metoprolol tartrate (LOPRESSOR) 25 MG tablet Take 1 tablet (25 mg total) by mouth 2 (two) times daily. 01/18/19 01/18/20  Carrie Mew, MD     Allergies Patient has no known allergies.   No family history on file.  Social History Social History   Tobacco Use  . Smoking status: Never Smoker  . Smokeless tobacco: Never Used  Substance Use Topics  . Alcohol use: No    Alcohol/week: 0.0 standard drinks  . Drug use: No    Review of Systems  Constitutional:   No fever or chills.  ENT:   No sore throat. No rhinorrhea. Cardiovascular:   No chest pain or syncope. Respiratory:   No dyspnea or  cough. Gastrointestinal:   Negative for abdominal pain, vomiting and diarrhea.  Musculoskeletal:   Chronic low back pain All other systems reviewed and are negative except as documented above in ROS and HPI.  ____________________________________________   PHYSICAL EXAM:  VITAL SIGNS: ED Triage Vitals  Enc Vitals Group     BP 01/18/19 0914 (!) 155/84     Pulse Rate 01/18/19 0914 (!) 123     Resp 01/18/19 0914 18     Temp 01/18/19 0914 98.2 F (36.8 C)     Temp Source 01/18/19 0914 Oral     SpO2 01/18/19 0914 98 %     Weight --      Height --      Head Circumference --      Peak Flow --      Pain Score 01/18/19 0912 0     Pain Loc --      Pain Edu? --      Excl. in [place]? --     Vital signs reviewed, nursing assessments reviewed.   Constitutional:   Alert and oriented. Non-toxic appearance. Eyes:   Conjunctivae are normal. EOMI. PERRL. ENT      Head:   Normocephalic and atraumatic.      Nose:   Wearing a mask.      Mouth/Throat:   Wearing a mask.      Neck:   No meningismus. Full ROM. Hematological/Lymphatic/Immunilogical:  No cervical lymphadenopathy. Cardiovascular:   Tachycardia heart rate 120. Symmetric bilateral radial and DP pulses.  No murmurs. Cap refill less than 2 seconds. Respiratory:   Normal respiratory effort without tachypnea/retractions. Breath sounds are clear and equal bilaterally. No wheezes/rales/rhonchi. Gastrointestinal:   Soft and nontender. Non distended. There is no CVA tenderness.  No rebound, rigidity, or guarding.  Musculoskeletal:   Normal range of motion in all extremities. No joint effusions.  No lower extremity tenderness.  No edema. Neurologic:   Normal speech and language.  Motor grossly intact. No acute focal neurologic deficits are appreciated.  Skin:    Skin is warm, dry and intact. No rash noted.  No petechiae, purpura, or bullae.  ____________________________________________    LABS (pertinent positives/negatives) (all labs  ordered are listed, but only abnormal results are displayed) Labs Reviewed  CBC WITH DIFFERENTIAL/PLATELET - Abnormal; Notable for the following components:      Result Value   Hemoglobin 12.8 (*)    MCV 77.1 (*)    MCH 24.2 (*)    RDW 16.3 (*)    Abs Immature Granulocytes 0.17 (*)    All other components within normal limits  COMPREHENSIVE METABOLIC PANEL - Abnormal; Notable for the following components:   Glucose, Bld 106 (*)    Total Protein 8.7 (*)    All other components within normal limits  TSH  T4, FREE   ____________________________________________   EKG  Interpreted by me Sinus tachycardia rate 129, normal axis, normal intervals.  LVH with associated repolarization abnormality/diffuse T wave inversions in inferior and lateral leads.  No acute ischemic changes.  Compared to prior EKGs in 2016 and 2019, no significant interval changes.  ____________________________________________    RADIOLOGY  Dg Chest Portable 1 View  Result Date: 01/18/2019 CLINICAL DATA:  Tachycardia EXAM: PORTABLE CHEST 1 VIEW COMPARISON:  03/16/2018 FINDINGS: The heart size and mediastinal contours are within normal limits. Low lung volumes. No focal airspace consolidation, pleural effusion, or pneumothorax. The visualized skeletal structures are unremarkable. IMPRESSION: No acute cardiopulmonary findings. Electronically Signed   By: Duanne Guess M.D.   On: 01/18/2019 09:55    ____________________________________________   PROCEDURES Procedures  ____________________________________________  DIFFERENTIAL DIAGNOSIS   Dehydration, electrolyte abnormality, hyperthyroidism, anemia  CLINICAL IMPRESSION / ASSESSMENT AND PLAN / ED COURSE  Medications ordered in the ED: Medications  sodium chloride 0.9 % bolus 1,000 mL (1,000 mLs Intravenous New Bag/Given 01/18/19 2774)    Pertinent labs & imaging results that were available during my care of the patient were reviewed by me and considered  in my medical decision making (see chart for details).  Stephen Bautista was evaluated in Emergency Department on 01/18/2019 for the symptoms described in the history of present illness. He was evaluated in the context of the global COVID-19 pandemic, which necessitated consideration that the patient might be at risk for infection with the SARS-CoV-2 virus that causes COVID-19. Institutional protocols and algorithms that pertain to the evaluation of patients at risk for COVID-19 are in a state of rapid change based on information released by regulatory bodies including the CDC and federal and state organizations. These policies and algorithms were followed during the patient's care in the ED.   Patient presents with sinus tachycardia on EKG in the absence of any specific symptoms.  Vital signs are otherwise unremarkable except for stage I hypertension.  Serum labs including electrolytes, TSH, CBC are all unremarkable.  After a liter of IV fluids tachycardia has improved to about 100,  indicating some component of dehydration.  I will start him on a low-dose of metoprolol, counseled him on vigorous hydration, recommend follow-up with cardiology this week.  Clinical Course as of Jan 18 1144  Wed Jan 18, 2019  1040 Hemoglobin(!): 12.8 [HM]  1041 CBC with Differential(!) [HM]    Clinical Course User Index [HM] Allene DillonMorris, Hannah B, Student-PA     ____________________________________________   FINAL CLINICAL IMPRESSION(S) / ED DIAGNOSES    Final diagnoses:  Sinus tachycardia     ED Discharge Orders         Ordered    metoprolol tartrate (LOPRESSOR) 25 MG tablet  2 times daily     01/18/19 1145          Portions of this note were generated with dragon dictation software. Dictation errors may occur despite best attempts at proofreading.   Sharman CheekStafford, Essam Lowdermilk, MD 01/18/19 [phone]

## 2019-01-18 NOTE — ED Notes (Signed)
Pt states that he went to the doctor this morning and he had a HR of 142, the doctor told instructed him to come to the ED. States that he has a history of hypertension. States that he has had a high heart rate previously from drinking energy drinks. Denies SOB, and chest pain.

## 2019-08-04 DIAGNOSIS — I10 Essential (primary) hypertension: Secondary | ICD-10-CM | POA: Insufficient documentation

## 2019-08-04 DIAGNOSIS — G4733 Obstructive sleep apnea (adult) (pediatric): Secondary | ICD-10-CM | POA: Insufficient documentation

## 2019-11-01 IMAGING — CR DG THORACIC SPINE 2V
1 series · 3 of 3 positions shown · non-contrast
Comparison: Chest CTA dated 11/26/2014.

CLINICAL DATA: Upper mid back pain and spasms following an
electrocution 2 days ago.

EXAM:
THORACIC SPINE 2 VIEWS

[Series 1: dg thoracic spine 2 view · 0.14mm/px · 3 of 3 slices shown]
[im 1/3]
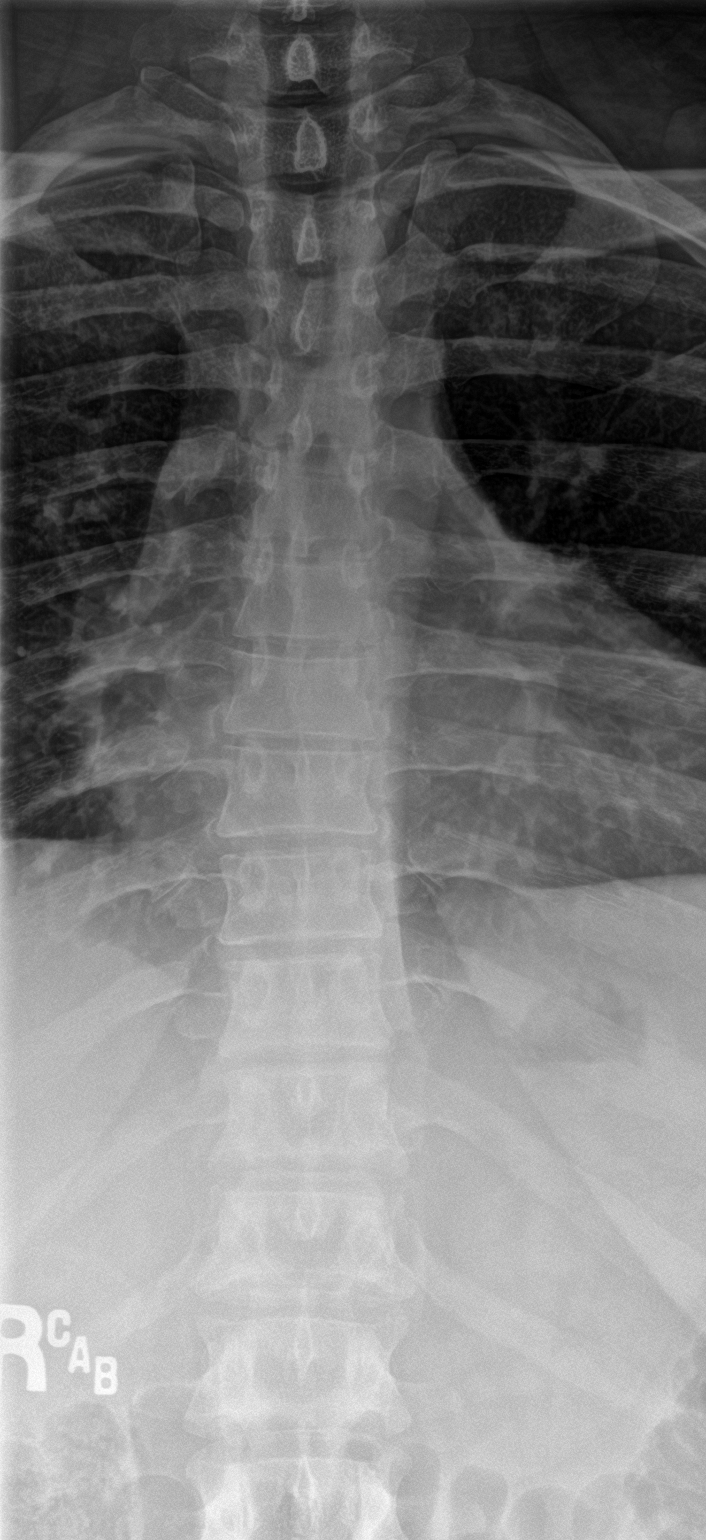
[im 2/3]
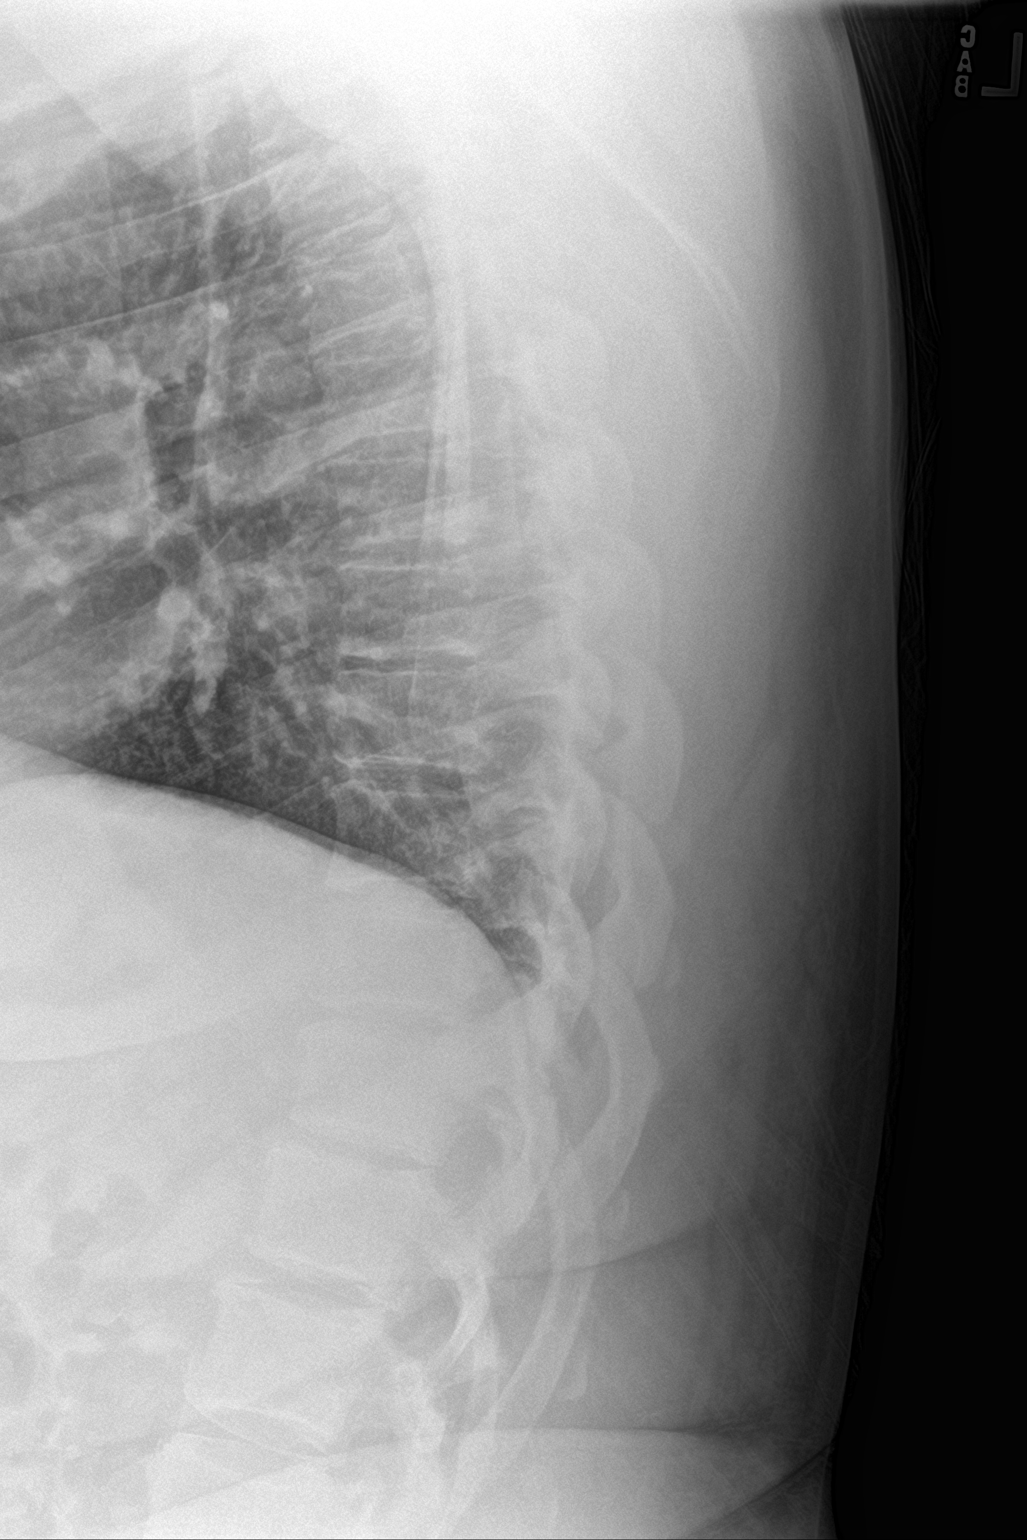
[im 3/3]
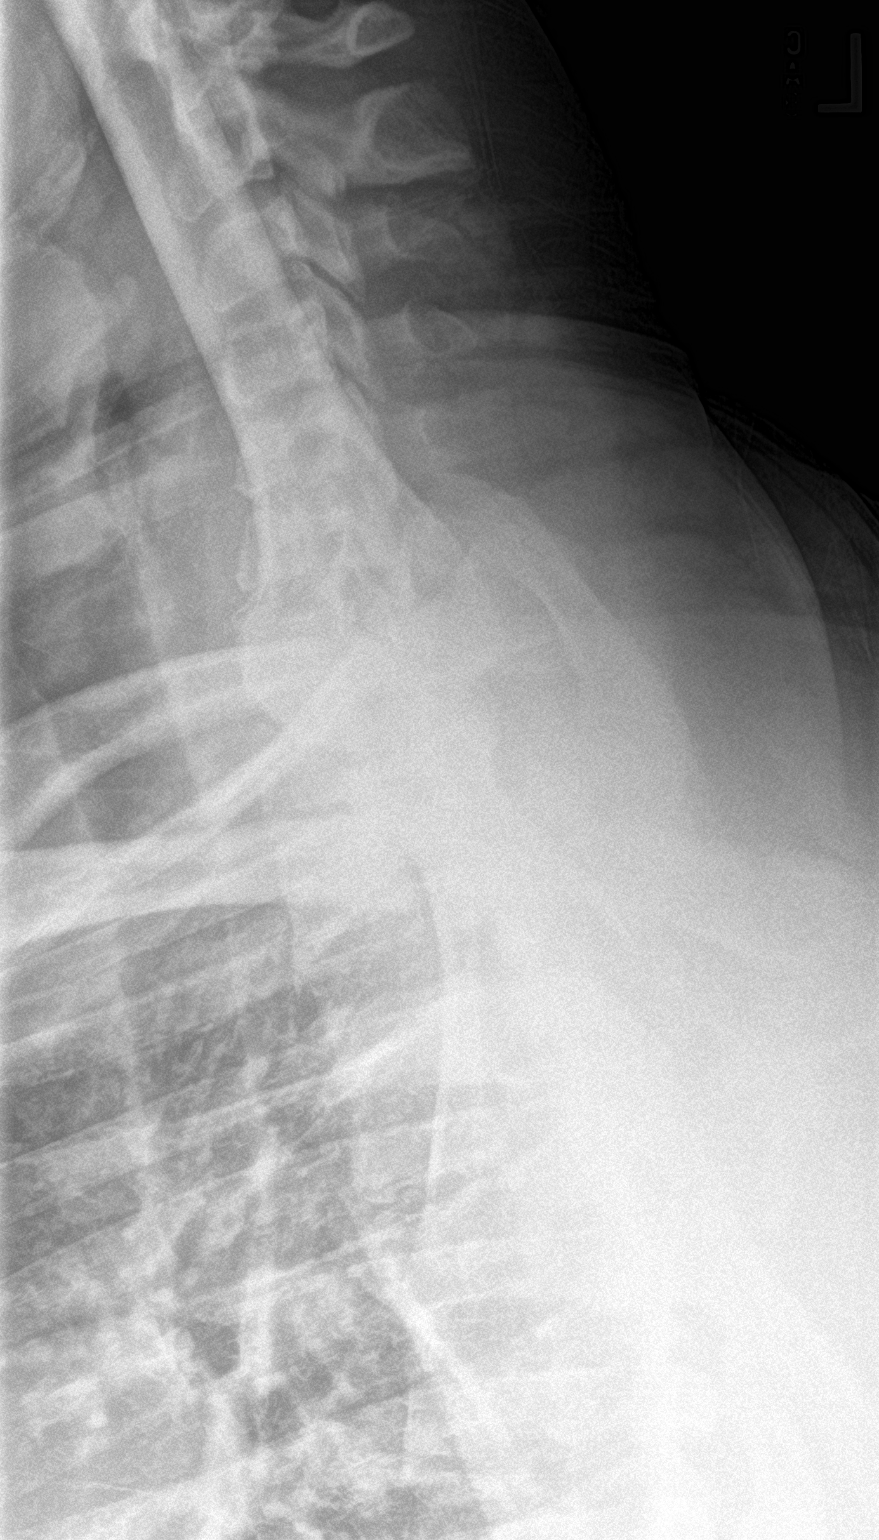

[3 of 3 positions shown; findings below may reference images not displayed]

FINDINGS: Stable minimal dextroconvex thoracic scoliosis. No fracture or
subluxation seen.
IMPRESSION: No fracture or subluxation.

## 2020-02-05 ENCOUNTER — Ambulatory Visit (INDEPENDENT_AMBULATORY_CARE_PROVIDER_SITE_OTHER): Payer: Medicaid Other | Admitting: Internal Medicine

## 2020-02-05 DIAGNOSIS — I10 Essential (primary) hypertension: Secondary | ICD-10-CM | POA: Insufficient documentation

## 2020-02-05 DIAGNOSIS — G479 Sleep disorder, unspecified: Secondary | ICD-10-CM

## 2020-02-05 NOTE — Progress Notes (Signed)
Sleep Medicine   Office Bautista  Patient Name: Stephen Bautista DOB: 1985-08-12 MRN [id number]    Chief Complaint: tired  Brief History:  Stephen Bautista was referred due to an elevated heart rate and daytime sleepiness. He presents with a 10+ year history of loud snoring and witnessed apnea. This is no different in any position.   The patient's bed partner reports  Loud snoring and witnessed apneas at night. The patient relates the following symptoms: gasping, tachycardia,GERD, frequent urination and morning headaches are also present. The patient goes to sleep at 10:30-11p.m.  He takes meloxicam, cyclobenzeprine and oxycodone before bed. He wakes up at 6:30-7 a.m.Marland Kitchen  he reports that his sleep quality is poor and he does not feel rested when he wakes.   Patient has noted some restlessness of his legs at night.  The patient  relates no unusual behavior during the night.  The patient reports a history of depression. The Epworth Sleepiness Score is 18 out of 24 . 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 He fights sleep driving but denies falling asleep behind the wheel. The patient relates  Cardiovascular risk factors include: hypertension  He has noticed increased irritablity.  ROS  General: (-) fever, (-) chills, (-) night sweat Nose and Sinuses: (-) nasal stuffiness or itchiness, (-) postnasal drip, (-) nosebleeds, (-) sinus trouble. Mouth and Throat: (-) sore throat, (-) hoarseness. Neck: (-) swollen glands, (-) enlarged thyroid, (-) neck pain. Respiratory: - cough, - shortness of breath, - wheezing. Neurologic: - numbness, - tingling. Psychiatric: - anxiety, - depression Sleep behavior: -sleep paralysis -hypnogogic hallucinations -dream enactment  -vivid dreams -cataplexy -night terrors -sleep walking   Current Medication: Outpatient Encounter Medications as of 02/05/2020  Medication Sig  . amitriptyline (ELAVIL) 10 MG tablet Take 10 mg by mouth at bedtime.  .  cyclobenzaprine (FLEXERIL) 10 MG tablet cyclobenzaprine 10 mg tablet  TAKE 1 TABLET BY MOUTH THREE TIMES DAILY AS NEEDED  . diclofenac (FLECTOR) 1.3 % PTCH Flector 1.3 % transdermal 12 hour patch  USE 2 PATCHES TOPICALLY TO AFFECTED AREA EVERY 12 HOURS  . metoprolol succinate (TOPROL-XL) 25 MG 24 hr tablet metoprolol succinate ER 25 mg tablet,extended release 24 hr  . amLODipine (NORVASC) 5 MG tablet Take 5 mg by mouth daily.  . diclofenac Sodium (VOLTAREN) 1 % GEL diclofenac 1 % topical gel  APPLY 2 GRAMS TO AFFECTED AREA 4 TIMES DAILY AS NEEDED FOR PAIN  . HYDROcodone-acetaminophen (NORCO/VICODIN) 5-325 MG tablet hydrocodone 5 mg-acetaminophen 325 mg tablet  TAKE 1 TABLET BY MOUTH THREE TIMES DAILY AS NEEDED FOR PAIN  . ketorolac (TORADOL) 10 MG tablet Take 1 tablet (10 mg total) by mouth every 8 (eight) hours.  . meloxicam (MOBIC) 15 MG tablet meloxicam 15 mg tablet  TAKE 1 TABLET BY MOUTH ONCE DAILY  . metoprolol tartrate (LOPRESSOR) 25 MG tablet Take 1 tablet (25 mg total) by mouth 2 (two) times daily.   No facility-administered encounter medications on file as of 02/05/2020.    Surgical History: History reviewed. No pertinent surgical history.  Medical History: Past Medical History:  Diagnosis Date  . Anxiety   . Chronic back pain   . Depression   . Hypertension   . Skull fracture (HCC)   . Tachycardia     Family History: Non contributory to the present illness  Social History: Social History   Socioeconomic History  . Marital status: Married    Spouse name: Not on file  .  Number of children: Not on file  . Years of education: Not on file  . Highest education level: Not on file  Occupational History  . Not on file  Tobacco Use  . Smoking status: Never Smoker  . Smokeless tobacco: Never Used  Vaping Use  . Vaping Use: Never assessed  Substance and Sexual Activity  . Alcohol use: No    Alcohol/week: 0.0 standard drinks  . Drug use: No  . Sexual activity: Not  on file  Other Topics Concern  . Not on file  Social History Narrative  . Not on file   Social Determinants of Health   Financial Resource Strain:   . Difficulty of Paying Living Expenses: Not on file  Food Insecurity:   . Worried About Programme researcher, broadcasting/film/video in the Last Year: Not on file  . Ran Out of Food in the Last Year: Not on file  Transportation Needs:   . Lack of Transportation (Medical): Not on file  . Lack of Transportation (Non-Medical): Not on file  Physical Activity:   . Days of Exercise per Week: Not on file  . Minutes of Exercise per Session: Not on file  Stress:   . Feeling of Stress : Not on file  Social Connections:   . Frequency of Communication with Friends and Family: Not on file  . Frequency of Social Gatherings with Friends and Family: Not on file  . Attends Religious Services: Not on file  . Active Member of Clubs or Organizations: Not on file  . Attends Banker Meetings: Not on file  . Marital Status: Not on file  Intimate Partner Violence:   . Fear of Current or Ex-Partner: Not on file  . Emotionally Abused: Not on file  . Physically Abused: Not on file  . Sexually Abused: Not on file    Vital Signs: Blood pressure (!) 165/106, pulse (!) 105, height 5\' 9"  (1.753 m), weight (!) 339 lb (153.8 kg), SpO2 (!) 9 %.  Examination: General Appearance: The patient is well-developed, well-nourished, and in no distress. Neck Circumference: 43 Skin: Gross inspection of skin unremarkable. Head: normocephalic, no gross deformities. Eyes: no gross deformities noted. ENT: ears appear grossly normal Neurologic: Alert and oriented. No involuntary movements.    EPWORTH SLEEPINESS SCALE:  Scale:  (0)= no chance of dozing; (1)= slight chance of dozing; (2)= moderate chance of dozing; (3)= high chance of dozing  Chance  Situtation    Sitting and reading: 3    Watching TV: 2    Sitting Inactive in public: 2    As a passenger in car: 3       Lying down to rest: 3    Sitting and talking: 1    Sitting quielty after lunch: 3    In a car, stopped in traffic: 1   TOTAL SCORE:   18 out of 24    SLEEP STUDIES:  1. N/A   LABS: No results found for this or any previous Bautista (from the past 2160 hour(s)).  Radiology: DG Chest Portable 1 View  Result Date: 01/18/2019 CLINICAL DATA:  Tachycardia EXAM: PORTABLE CHEST 1 VIEW COMPARISON:  03/16/2018 FINDINGS: The heart size and mediastinal contours are within normal limits. Low lung volumes. No focal airspace consolidation, pleural effusion, or pneumothorax. The visualized skeletal structures are unremarkable. IMPRESSION: No acute cardiopulmonary findings. Electronically Signed   By: 03/18/2018 M.D.   On: 01/18/2019 09:55     Assessment and Plan: Patient Active Problem  List   Diagnosis Date Noted  . Sleep disturbance 02/05/2020  . HTN (hypertension) 02/05/2020  . Morbid obesity (HCC) 02/05/2020     PLAN OSA:   Patient evaluation suggests high risk of sleep disordered breathing due to loud snoring, witnessed apnea, gasping, morning headaches and hypersomnia. Patient has comorbid cardiovascular risk factors including: tachycardia and hypertension which could be exacerbated by pathologic sleep-disordered breathing.  Suggest: PSG to assess/treat the patient's sleep disordered breathing. The patient was also counselled on avoiding driving while sleepy.   1. Sleep disturbance: Probable OSA- PSG will be ordered 2. Hypertension: History of HTN as well as tachycardia, followed by cardiology, discussed his elevated BP today, will need to monitor BP and HR and follow-up with PCP/cardiology as needed 3. Morbid obesity: Will need to focus on healthy eating and exercising daily as needed   General Counseling: I have discussed the findings of the evaluation and examination with Stephen Bautista.  I have also discussed any further diagnostic evaluation thatmay be needed or ordered today.  Stephen Bautista. We also reviewed his medications today and discussed drug interactions and side effects including but not limited excessive drowsiness and altered mental states. We also discussed that there is always a risk not just to him but also people around him. he has been encouraged to call the office with any questions or concerns that should arise related to todays Bautista.    I have personally obtained a history, evaluated the patient, evaluated pertinent data, formulated the assessment and plan and placed orders.  This patient was seen by Leeanne Deed AGNP-C in Collaboration with Dr. Freda Munro as a part of collaborative care agreement.  Valentino Hue Sol Blazing, PhD, FAASM  Diplomate, American Board of Sleep Medicine    Yevonne Pax, MD [facility] Diplomate ABMS Pulmonary and Critical Care Medicine Sleep medicine

## 2020-02-05 NOTE — Patient Instructions (Signed)

## 2020-06-24 ENCOUNTER — Ambulatory Visit (INDEPENDENT_AMBULATORY_CARE_PROVIDER_SITE_OTHER): Payer: Medicaid Other | Admitting: Internal Medicine

## 2020-06-24 VITALS — BP 145/93 | HR 115 | Temp 98.8°F | Resp 16 | Ht 70.0 in | Wt 337.0 lb

## 2020-06-24 DIAGNOSIS — Z7189 Other specified counseling: Secondary | ICD-10-CM | POA: Diagnosis not present

## 2020-06-24 DIAGNOSIS — Z6841 Body Mass Index (BMI) 40.0 and over, adult: Secondary | ICD-10-CM

## 2020-06-24 DIAGNOSIS — Z9989 Dependence on other enabling machines and devices: Secondary | ICD-10-CM

## 2020-06-24 DIAGNOSIS — G4733 Obstructive sleep apnea (adult) (pediatric): Secondary | ICD-10-CM | POA: Diagnosis not present

## 2020-06-24 DIAGNOSIS — I10 Essential (primary) hypertension: Secondary | ICD-10-CM
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
## 2020-06-24 NOTE — Patient Instructions (Signed)

## 2020-10-10 ENCOUNTER — Other Ambulatory Visit: Payer: Self-pay

## 2020-10-10 ENCOUNTER — Emergency Department
Admission: EM | Admit: 2020-10-10 | Discharge: 2020-10-10 | Disposition: A | Discharge status: Home / Self Care | Payer: Medicaid Other | Attending: Emergency Medicine | Admitting: Emergency Medicine

## 2020-10-10 DIAGNOSIS — Z79899 Other long term (current) drug therapy: Secondary | ICD-10-CM | POA: Insufficient documentation

## 2020-10-10 DIAGNOSIS — D509 Iron deficiency anemia, unspecified: Secondary | ICD-10-CM | POA: Insufficient documentation

## 2020-10-10 DIAGNOSIS — R5383 Other fatigue: Secondary | ICD-10-CM | POA: Diagnosis present

## 2020-10-10 DIAGNOSIS — I10 Essential (primary) hypertension: Secondary | ICD-10-CM | POA: Insufficient documentation

## 2020-10-10 LAB — CBC
HCT: 37.7 % — ABNORMAL LOW (ref 39.0–52.0)
Hemoglobin: 11.6 g/dL — ABNORMAL LOW (ref 13.0–17.0)
MCH: 22.7 pg — ABNORMAL LOW (ref 26.0–34.0)
MCHC: 30.8 g/dL (ref 30.0–36.0)
MCV: 73.9 fL — ABNORMAL LOW (ref 80.0–100.0)
Platelets: 322 10*3/uL (ref 150–400)
RBC: 5.1 MIL/uL (ref 4.22–5.81)
RDW: 16.5 % — ABNORMAL HIGH (ref 11.5–15.5)
WBC: 9.8 10*3/uL (ref 4.0–10.5)
nRBC: 0 % (ref 0.0–0.2)

## 2020-10-10 LAB — COMPREHENSIVE METABOLIC PANEL
ALT: 27 U/L (ref 0–44)
AST: 23 U/L (ref 15–41)
Albumin: 3.7 g/dL (ref 3.5–5.0)
Alkaline Phosphatase: 69 U/L (ref 38–126)
Anion gap: 6 (ref 5–15)
BUN: 10 mg/dL (ref 6–20)
CO2: 29 mmol/L (ref 22–32)
Calcium: 8.8 mg/dL — ABNORMAL LOW (ref 8.9–10.3)
Chloride: 100 mmol/L (ref 98–111)
Creatinine, Ser: 0.71 mg/dL (ref 0.61–1.24)
GFR, Estimated: >60 mL/min (ref >60)
Glucose, Bld: 99 mg/dL (ref 70–99)
Potassium: 3.7 mmol/L (ref 3.5–5.1)
Sodium: 135 mmol/L (ref 135–145)
Total Bilirubin: 0.4 mg/dL (ref 0.3–1.2)
Total Protein: 8.5 g/dL — ABNORMAL HIGH (ref 6.5–8.1)

## 2020-10-10 LAB — LIPASE, BLOOD: Lipase: 27 U/L (ref 11–51)

## 2020-10-10 MED ORDER — SODIUM CHLORIDE 0.9 % IV SOLN
1000.0000 mL | Freq: Once | INTRAVENOUS | Status: AC
  Administered 2020-10-10: 1000 mL via INTRAVENOUS

## 2020-10-10 NOTE — ED Provider Notes (Signed)
[facility] Emergency Department Provider Note   ____________________________________________    I have reviewed the triage vital signs and the nursing notes.   HISTORY  Chief Complaint     HPI Kit Mollett is a 35 y.o. male with history as noted below who presents with complaints of fatigue over the last 2 weeks.  Patient reports he has had low energy.  He reports he did see his PCP last week and noted that he was anemic and prescribed iron supplementation.  He had some concerns that his urine was darker than usual as well and was told that he was dehydrated.  He reports increased water intake.  Has not been out in the extreme heat significantly.  No muscle aches.  No nausea vomiting or abdominal pain.  Chronic back pain which occasionally radiates to his abdomen.  Past Medical History:  Diagnosis Date   Anxiety    Chronic back pain    Depression    Hypertension    Skull fracture (HCC)    Tachycardia     Patient Active Problem List   Diagnosis Date Noted   Sleep disturbance 02/05/2020   HTN (hypertension) 02/05/2020   Benign essential HTN 08/04/2019   OSA (obstructive sleep apnea) 08/04/2019    No past surgical history on file.  Prior to Admission medications   Medication Sig Start Date End Date Taking? Authorizing Provider  amitriptyline (ELAVIL) 10 MG tablet Take 10 mg by mouth at bedtime.    [provider]  amLODipine (NORVASC) 5 MG tablet Take 5 mg by mouth daily. 01/27/20   [provider]  amLODipine (NORVASC) 5 MG tablet amlodipine 5 mg tablet 01/18/20   [provider]  cyclobenzaprine (FLEXERIL) 10 MG tablet cyclobenzaprine 10 mg tablet  TAKE 1 TABLET BY MOUTH THREE TIMES DAILY AS NEEDED 02/10/18   [provider]  diclofenac (FLECTOR) 1.3 % PTCH Flector 1.3 % transdermal 12 hour patch  USE 2 PATCHES TOPICALLY TO AFFECTED AREA EVERY 12 HOURS 07/25/19   [provider]  diclofenac  Sodium (VOLTAREN) 1 % GEL diclofenac 1 % topical gel  APPLY 2 GRAMS TO AFFECTED AREA 4 TIMES DAILY AS NEEDED FOR PAIN    [provider]  HYDROcodone-acetaminophen (NORCO/VICODIN) 5-325 MG tablet hydrocodone 5 mg-acetaminophen 325 mg tablet  TAKE 1 TABLET BY MOUTH THREE TIMES DAILY AS NEEDED FOR PAIN    [provider]  ibuprofen (ADVIL) 800 MG tablet ibuprofen 800 mg tablet 02/24/18   [provider]  lidocaine (LIDODERM) 5 % lidocaine 5 % topical patch  APPLY 1 PATCH TOPICALLY ONCE DAILY (WEAR UP TO 12 HOURS)    [provider]  meloxicam (MOBIC) 15 MG tablet meloxicam 15 mg tablet  TAKE 1 TABLET BY MOUTH ONCE DAILY    [provider]  metoprolol succinate (TOPROL-XL) 25 MG 24 hr tablet metoprolol succinate ER 25 mg tablet,extended release 24 hr 01/18/20   [provider]  metoprolol tartrate (LOPRESSOR) 25 MG tablet Take 1 tablet (25 mg total) by mouth 2 (two) times daily. 01/18/19 01/18/20  Sharman Cheek, MD  traMADol (ULTRAM) 50 MG tablet tramadol 50 mg tablet  Take 1 tablet every 6 hours by oral route.    [provider]     Allergies Patient has no known allergies.  No family history on file.  Social History Social History   Tobacco Use   Smoking status: Never   Smokeless tobacco: Never  Substance Use Topics  Alcohol use: No    Alcohol/week: 0.0 standard drinks   Drug use: No    Review of Systems  Constitutional: No fever/chills Eyes: No visual changes.  ENT: No sore throat. Cardiovascular: Denies chest pain. Respiratory: Denies shortness of breath. Gastrointestinal: As above Genitourinary: Negative for dysuria.  Urine as above Musculoskeletal: As above Skin: Negative for rash. Neurological: Negative for headaches or weakness   ____________________________________________   PHYSICAL EXAM:  VITAL SIGNS: ED Triage Vitals  Enc Vitals Group     BP 10/10/20 1005 (!) 147/96     Pulse Rate 10/10/20  1005 (!) 104     Resp 10/10/20 1005 20     Temp 10/10/20 1005 98.3 F (36.8 C)     Temp Source 10/10/20 1005 Oral     SpO2 10/10/20 1005 97 %     Weight 10/10/20 1006 (!) 149.7 kg (330 lb)     Height 10/10/20 1006 1.778 m (5\' 10" )     Head Circumference --      Peak Flow --      Pain Score 10/10/20 1006 8     Pain Loc --      Pain Edu? --      Excl. in GC? --     Constitutional: Alert and oriented. No acute distress. Pleasant and interactive  Nose: No congestion/rhinnorhea. Mouth/Throat: Mucous membranes are moist.   Neck:  Painless ROM Cardiovascular: Normal rate, regular rhythm. Grossly normal heart sounds.  Good peripheral circulation. Respiratory: Normal respiratory effort.  No retractions. Lungs CTAB. Gastrointestinal: Soft and nontender. No distention.  No CVA tenderness.  Musculoskeletal: No lower extremity tenderness nor edema.  Warm and well perfused Neurologic:  Normal speech and language. No gross focal neurologic deficits are appreciated.  Skin:  Skin is warm, dry and intact. No rash noted. Psychiatric: Mood and affect are normal. Speech and behavior are normal.  ____________________________________________   LABS (all labs ordered are listed, but only abnormal results are displayed)  Labs Reviewed  COMPREHENSIVE METABOLIC PANEL - Abnormal; Notable for the following components:      Result Value   Calcium 8.8 (*)    Total Protein 8.5 (*)    All other components within normal limits  CBC - Abnormal; Notable for the following components:   Hemoglobin 11.6 (*)    HCT 37.7 (*)    MCV 73.9 (*)    MCH 22.7 (*)    RDW 16.5 (*)    All other components within normal limits  LIPASE, BLOOD  URINALYSIS, COMPLETE (UACMP) WITH MICROSCOPIC   ____________________________________________  EKG  None ____________________________________________  RADIOLOGY  None ____________________________________________   PROCEDURES  Procedure(s) performed:  No  Procedures   Critical Care performed: No ____________________________________________   INITIAL IMPRESSION / ASSESSMENT AND PLAN / ED COURSE  Pertinent labs & imaging results that were available during my care of the patient were reviewed by me and considered in my medical decision making (see chart for details).   Patient presents with fatigue as noted above.  Reports he had COVID in January but recovered fully from that.  No viral symptoms recently.  Lab work is notable for mild likely iron deficiency anemia, LFTs, lipase, kidney function normal.  Electrolytes normal.   Normal saline 1 L bolus given in the emergency department, appropriate for outpatient continued work-up, continue iron supplementation    ____________________________________________   FINAL CLINICAL IMPRESSION(S) / ED DIAGNOSES  Final diagnoses:  Fatigue, unspecified type  Iron deficiency anemia, unspecified iron deficiency anemia  type        Note:  This document was prepared using Dragon voice recognition software and may include unintentional dictation errors.    Jene Every, MD 10/10/20 1113

## 2020-10-10 NOTE — ED Notes (Signed)
See triage note  Presents with weakness and some body aches  States he was seen by PCP and told that he was dehydrated  States he felt better until today  Afebrile on arrival

## 2020-10-10 NOTE — ED Notes (Signed)
Flow up pcp info provided all questions answered

## 2020-10-10 NOTE — ED Triage Notes (Signed)
Feeling fatigued for the past two weeks.  Reports thinks dehydrated with dark urine.  Also reports some generalized abd discomfort.

## 2020-10-10 NOTE — ED Notes (Signed)
Blood drawn and sent to lab, patient aware urine sample needed.

## 2021-06-23 ENCOUNTER — Ambulatory Visit: Payer: Medicaid Other | Admitting: Internal Medicine

## 2021-06-23 NOTE — Progress Notes (Signed)
Pt did not show for scheduled appointment.  

## 2021-07-11 ENCOUNTER — Encounter: Payer: Self-pay | Admitting: Emergency Medicine

## 2021-07-11 ENCOUNTER — Other Ambulatory Visit: Payer: Self-pay

## 2021-07-11 ENCOUNTER — Emergency Department
Admission: EM | Admit: 2021-07-11 | Discharge: 2021-07-11 | Disposition: A | Discharge status: Home / Self Care | Payer: Medicaid Other | Attending: Emergency Medicine | Admitting: Emergency Medicine

## 2021-07-11 DIAGNOSIS — I509 Heart failure, unspecified: Secondary | ICD-10-CM | POA: Diagnosis not present

## 2021-07-11 DIAGNOSIS — I110 Hypertensive heart disease with heart failure: Secondary | ICD-10-CM | POA: Insufficient documentation

## 2021-07-11 DIAGNOSIS — M546 Pain in thoracic spine: Secondary | ICD-10-CM | POA: Diagnosis present

## 2021-07-11 DIAGNOSIS — M545 Low back pain, unspecified: Secondary | ICD-10-CM | POA: Diagnosis not present

## 2021-07-11 MED ORDER — PREDNISONE 10 MG (21) PO TBPK: ORAL_TABLET | ORAL | 0 refills | Status: DC

## 2021-07-11 MED ORDER — HYDROCODONE-ACETAMINOPHEN 5-325 MG PO TABS: 1.0000 | ORAL_TABLET | Freq: Four times a day (QID) | ORAL | 0 refills | Status: AC | PRN

## 2021-07-11 MED ORDER — KETOROLAC TROMETHAMINE 60 MG/2ML IM SOLN
30.0000 mg | Freq: Once | INTRAMUSCULAR | Status: AC
  Administered 2021-07-11: 30 mg via INTRAMUSCULAR
  Filled 2021-07-11: qty 2

## 2021-07-11 NOTE — ED Provider Notes (Signed)
? ?[facility] ?Provider Note ? ? ? Event Date/Time  ? First MD Initiated Contact with Patient 07/11/21 [phone]   ?  (approximate) ? ? ?History  ? ?Back Pain ? ? ?HPI ? ?Stephen Bautista is a 36 y.o. male with history of hypertension, CHF, chronic thoracic and lumbar back pain, osteoarthritis and as listed in EMR presents to the emergency department for treatment and evaluation of acute exacerbation of chronic thoracic back pain.  Patient denies new injury.  Pain is similar to previous incidences.  He had contacted his orthopedist but was unable to get an appointment today.  Pain has progressively worsened over the past week.. ? ?  ? ? ?Physical Exam  ? ?Triage Vital Signs: ?ED Triage Vitals  ?Enc Vitals Group  ?   BP 07/11/21 0924 (!) 158/105  ?   Pulse Rate 07/11/21 0924 (!) 123  ?   Resp --   ?   Temp 07/11/21 0924 98.1 ?F (36.7 ?C)  ?   Temp Source 07/11/21 0924 Oral  ?   SpO2 07/11/21 0924 99 %  ?   Weight 07/11/21 0921 (!) 330 lb 0.5 oz (149.7 kg)  ?   Height 07/11/21 0921 5\' 10"  (1.778 m)  ?   Head Circumference --   ?   Peak Flow --   ?   Pain Score 07/11/21 0921 8  ?   Pain Loc --   ?   Pain Edu? --   ?   Excl. in GC? --   ? ? ?Most recent vital signs: ?Vitals:  ? 07/11/21 0924  ?BP: (!) 158/105  ?Pulse: (!) 123  ?Temp: 98.1 ?F (36.7 ?C)  ?SpO2: 99%  ? ? ?General: Awake, no distress.  ?CV:  Good peripheral perfusion.  ?Resp:  Normal effort.  ?Abd:  No distention.  ?Other:  Diffuse pain over the mid thoracic area.  ? ? ?ED Results / Procedures / Treatments  ? ?Labs ?(all labs ordered are listed, but only abnormal results are displayed) ?Labs Reviewed - No data to display ? ? ?EKG ? ? ? ? ?RADIOLOGY ? ?Image and radiology report reviewed by me. ? ?Not indicated ? ?PROCEDURES: ? ?Critical Care performed: No ? ?Procedures ? ? ?MEDICATIONS ORDERED IN ED: ?Medications  ?ketorolac (TORADOL) injection 30 mg (30 mg Intramuscular Given 07/11/21 0955)  ? ? ? ?IMPRESSION / MDM / ASSESSMENT AND  PLAN / ED COURSE  ? ?I have reviewed the triage note. ? ?Differential diagnosis includes, but is not limited to: Acute on chronic thoracic back pain ? ?36 year old male presenting to the emergency department for treatment of acute on chronic thoracic back pain.  Patient has been trying to manage this at home with over-the-counter medications but states that the pain has gotten worse.  He is an 30 and states that he sits for long periods of time each day.  He feels that this contributes to his back issues.  Current symptoms are not different than previous flares.  He states that steroids and muscle relaxers have helped him in the past.  Plan will be to give him an injection of Toradol while here. ? ?Toradol helped somewhat.  Patient will be discharged home with prescription for prednisone and Vicodin.  He already has Flexeril at home which he takes nightly.  He was continue to take all other medications as prescribed.  He was advised not to drive while taking Vicodin or muscle relaxer.  He is to follow-up with  his primary care or orthopedist for symptoms that are not improving with medication or change/worsen.  ER return precautions discussed. ? ?  ? ? ?FINAL CLINICAL IMPRESSION(S) / ED DIAGNOSES  ? ?Final diagnoses:  ?Acute bilateral thoracic back pain  ? ? ? ?Rx / DC Orders  ? ?ED Discharge Orders   ? ?      Ordered  ?  predniSONE (STERAPRED UNI-PAK 21 TAB) 10 MG (21) TBPK tablet       ? 07/11/21 1109  ?  HYDROcodone-acetaminophen (NORCO/VICODIN) 5-325 MG tablet  Every 6 hours PRN       ? 07/11/21 1109  ? ?  ?  ? ?  ? ? ? ?Note:  This document was prepared using Dragon voice recognition software and may include unintentional dictation errors. ?  ?Chinita Pester, FNP ?07/11/21 1120 ? ?  ?Minna Antis, MD ?07/11/21 1342 ? ?
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
## 2021-07-11 NOTE — ED Triage Notes (Signed)
C/O chronic mid back pain. States pain has been ongoing x 2 years, worse over past week. ?

## 2021-08-19 ENCOUNTER — Emergency Department
Admission: EM | Admit: 2021-08-19 | Discharge: 2021-08-19 | Disposition: A | Discharge status: Home / Self Care | Payer: Medicaid Other | Attending: Emergency Medicine | Admitting: Emergency Medicine

## 2021-08-19 ENCOUNTER — Emergency Department: Payer: Medicaid Other

## 2021-08-19 ENCOUNTER — Other Ambulatory Visit: Payer: Self-pay

## 2021-08-19 DIAGNOSIS — S4992XA Unspecified injury of left shoulder and upper arm, initial encounter: Secondary | ICD-10-CM | POA: Insufficient documentation

## 2021-08-19 DIAGNOSIS — Y92410 Unspecified street and highway as the place of occurrence of the external cause: Secondary | ICD-10-CM | POA: Diagnosis not present

## 2021-08-19 DIAGNOSIS — S199XXA Unspecified injury of neck, initial encounter: Secondary | ICD-10-CM | POA: Insufficient documentation

## 2021-08-19 DIAGNOSIS — S0990XA Unspecified injury of head, initial encounter: Secondary | ICD-10-CM | POA: Insufficient documentation

## 2021-08-19 DIAGNOSIS — S299XXA Unspecified injury of thorax, initial encounter: Secondary | ICD-10-CM | POA: Insufficient documentation

## 2021-08-19 MED ORDER — CYCLOBENZAPRINE HCL 10 MG PO TABS: 10.0000 mg | ORAL_TABLET | Freq: Three times a day (TID) | ORAL | 0 refills | Status: AC | PRN

## 2021-08-19 MED ORDER — HYDROCODONE-ACETAMINOPHEN 5-325 MG PO TABS
2.0000 | ORAL_TABLET | Freq: Once | ORAL | Status: AC
  Administered 2021-08-19: 2 via ORAL
  Filled 2021-08-19: qty 2

## 2021-08-19 MED ORDER — HYDROCODONE-ACETAMINOPHEN 5-325 MG PO TABS: 1.0000 | ORAL_TABLET | ORAL | 0 refills | Status: AC | PRN
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
## 2021-09-09 ENCOUNTER — Emergency Department
Admission: EM | Admit: 2021-09-09 | Discharge: 2021-09-09 | Discharge status: Against Medical Advice | Payer: Medicaid Other | Attending: Emergency Medicine | Admitting: Emergency Medicine

## 2021-09-09 ENCOUNTER — Encounter: Payer: Self-pay | Admitting: Emergency Medicine

## 2021-09-09 ENCOUNTER — Emergency Department: Payer: Medicaid Other

## 2021-09-09 DIAGNOSIS — H538 Other visual disturbances: Secondary | ICD-10-CM | POA: Insufficient documentation

## 2021-09-09 DIAGNOSIS — R519 Headache, unspecified: Secondary | ICD-10-CM | POA: Diagnosis present

## 2021-09-09 DIAGNOSIS — R42 Dizziness and giddiness: Secondary | ICD-10-CM | POA: Diagnosis not present

## 2021-09-09 DIAGNOSIS — Z5321 Procedure and treatment not carried out due to patient leaving prior to being seen by health care provider: Secondary | ICD-10-CM | POA: Diagnosis not present

## 2021-09-09 DIAGNOSIS — I10 Essential (primary) hypertension: Secondary | ICD-10-CM | POA: Insufficient documentation

## 2021-09-09 LAB — BASIC METABOLIC PANEL
Anion gap: 7 (ref 5–15)
BUN: 16 mg/dL (ref 6–20)
CO2: 27 mmol/L (ref 22–32)
Calcium: 9.2 mg/dL (ref 8.9–10.3)
Chloride: 102 mmol/L (ref 98–111)
Creatinine, Ser: 0.85 mg/dL (ref 0.61–1.24)
GFR, Estimated: >60 mL/min (ref >60)
Glucose, Bld: 96 mg/dL (ref 70–99)
Potassium: 3.6 mmol/L (ref 3.5–5.1)
Sodium: 136 mmol/L (ref 135–145)

## 2021-09-09 LAB — CBC
HCT: 39.7 % (ref 39.0–52.0)
Hemoglobin: 11.8 g/dL — ABNORMAL LOW (ref 13.0–17.0)
MCH: 23.7 pg — ABNORMAL LOW (ref 26.0–34.0)
MCHC: 29.7 g/dL — ABNORMAL LOW (ref 30.0–36.0)
MCV: 79.9 fL — ABNORMAL LOW (ref 80.0–100.0)
Platelets: 341 10*3/uL (ref 150–400)
RBC: 4.97 MIL/uL (ref 4.22–5.81)
RDW: 16.2 % — ABNORMAL HIGH (ref 11.5–15.5)
WBC: 10.3 10*3/uL (ref 4.0–10.5)
nRBC: 0 % (ref 0.0–0.2)

## 2021-09-09 LAB — TROPONIN I (HIGH SENSITIVITY): Troponin I (High Sensitivity): 6 ng/L (ref <18)

## 2021-09-09 NOTE — ED Triage Notes (Addendum)
Pt presents via POV with complaints of a headache - endorses dizziness with "sudden movement" with associated blurred vision. Pt seen here 2 weeks ago for same. Pt has a hx of HTN and states hes been compliant with his medications. Denies N/V, falls or LOC.

## 2021-09-09 NOTE — ED Notes (Signed)
No answer when called several times from lobby 

## 2021-09-10 ENCOUNTER — Other Ambulatory Visit: Payer: Self-pay | Admitting: Family Medicine

## 2021-09-10 DIAGNOSIS — R519 Headache, unspecified: Secondary | ICD-10-CM

## 2021-09-14 ENCOUNTER — Ambulatory Visit
Admission: RE | Admit: 2021-09-14 | Discharge: 2021-09-14 | Disposition: A | Discharge status: Home / Self Care | Payer: Medicaid Other | Source: Ambulatory Visit | Attending: Family Medicine | Admitting: Family Medicine

## 2021-09-14 DIAGNOSIS — R519 Headache, unspecified: Secondary | ICD-10-CM | POA: Insufficient documentation

## 2021-09-14 MED ORDER — GADOBUTROL 1 MMOL/ML IV SOLN
10.0000 mL | Freq: Once | INTRAVENOUS | Status: AC | PRN
  Administered 2021-09-14: 10 mL via INTRAVENOUS

## 2021-10-09 DIAGNOSIS — M545 Low back pain, unspecified: Secondary | ICD-10-CM | POA: Insufficient documentation

## 2021-10-09 DIAGNOSIS — G894 Chronic pain syndrome: Secondary | ICD-10-CM | POA: Insufficient documentation

## 2022-06-04 ENCOUNTER — Encounter: Payer: Self-pay | Admitting: Emergency Medicine

## 2022-06-04 ENCOUNTER — Emergency Department: Payer: Medicaid Other

## 2022-06-04 ENCOUNTER — Emergency Department
Admission: EM | Admit: 2022-06-04 | Discharge: 2022-06-04 | Disposition: A | Discharge status: Home / Self Care | Payer: Medicaid Other | Attending: Emergency Medicine | Admitting: Emergency Medicine

## 2022-06-04 ENCOUNTER — Other Ambulatory Visit: Payer: Self-pay

## 2022-06-04 DIAGNOSIS — I10 Essential (primary) hypertension: Secondary | ICD-10-CM | POA: Diagnosis not present

## 2022-06-04 DIAGNOSIS — D72829 Elevated white blood cell count, unspecified: Secondary | ICD-10-CM | POA: Insufficient documentation

## 2022-06-04 DIAGNOSIS — R000 Tachycardia, unspecified: Secondary | ICD-10-CM | POA: Diagnosis not present

## 2022-06-04 DIAGNOSIS — R0789 Other chest pain: Secondary | ICD-10-CM | POA: Diagnosis present

## 2022-06-04 DIAGNOSIS — Z79899 Other long term (current) drug therapy: Secondary | ICD-10-CM | POA: Diagnosis not present

## 2022-06-04 LAB — BASIC METABOLIC PANEL
Anion gap: 9 (ref 5–15)
BUN: 17 mg/dL (ref 6–20)
CO2: 28 mmol/L (ref 22–32)
Calcium: 8.7 mg/dL — ABNORMAL LOW (ref 8.9–10.3)
Chloride: 97 mmol/L — ABNORMAL LOW (ref 98–111)
Creatinine, Ser: 0.92 mg/dL (ref 0.61–1.24)
GFR, Estimated: >60 mL/min (ref >60)
Glucose, Bld: 94 mg/dL (ref 70–99)
Potassium: 3.8 mmol/L (ref 3.5–5.1)
Sodium: 134 mmol/L — ABNORMAL LOW (ref 135–145)

## 2022-06-04 LAB — CBC
HCT: 39.9 % (ref 39.0–52.0)
Hemoglobin: 12.1 g/dL — ABNORMAL LOW (ref 13.0–17.0)
MCH: 24.4 pg — ABNORMAL LOW (ref 26.0–34.0)
MCHC: 30.3 g/dL (ref 30.0–36.0)
MCV: 80.4 fL (ref 80.0–100.0)
Platelets: 380 10*3/uL (ref 150–400)
RBC: 4.96 MIL/uL (ref 4.22–5.81)
RDW: 16.6 % — ABNORMAL HIGH (ref 11.5–15.5)
WBC: 11.3 10*3/uL — ABNORMAL HIGH (ref 4.0–10.5)
nRBC: 0 % (ref 0.0–0.2)

## 2022-06-04 LAB — TROPONIN I (HIGH SENSITIVITY)
Troponin I (High Sensitivity): 5 ng/L (ref <18)
Troponin I (High Sensitivity): 3 ng/L (ref <18)

## 2022-06-04 LAB — D-DIMER, QUANTITATIVE: D-Dimer, Quant: <0.27 ug/mL-FEU (ref 0.00–0.50)

## 2022-06-04 MED ORDER — KETOROLAC TROMETHAMINE 30 MG/ML IJ SOLN
30.0000 mg | Freq: Once | INTRAMUSCULAR | Status: AC
  Administered 2022-06-04: 30 mg via INTRAVENOUS
  Filled 2022-06-04: qty 1

## 2022-06-04 MED ORDER — LIDOCAINE 5 % EX PTCH: 1.0000 | MEDICATED_PATCH | Freq: Two times a day (BID) | CUTANEOUS | 0 refills | Status: AC

## 2022-06-04 NOTE — Discharge Instructions (Addendum)
You may alternate Tylenol 1000 mg every 6 hours as needed for pain, fever and Ibuprofen 800 mg every 6-8 hours as needed for pain, fever.  Please take Ibuprofen with food.  Do not take more than 4000 mg of Tylenol (acetaminophen) in a 24 hour period. ° °

## 2022-06-04 NOTE — ED Triage Notes (Signed)
Patient ambulatory to triage with steady gait, without difficulty or distress noted; pt reports upper CP, nonradiating since last night; st pain increases with deep breathing

## 2022-06-04 NOTE — ED Provider Notes (Signed)
[facility] Provider Note    Event Date/Time   First MD Initiated Contact with Patient 06/04/22 [phone]     (approximate)   History   Chest Pain   HPI  Stephen Bautista is a 37 y.o. male with history of hypertension, obesity who presents to the emergency department right-sided sharp chest pain worse with movement and deep inspiration.  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  No history of PE or DVT.  No lower extremity swelling or pain.  No fevers or productive cough.   History provided by patient.    Past Medical History:  Diagnosis Date   Anxiety    Chronic back pain    Depression    Hypertension    Skull fracture (HCC)    Tachycardia     History reviewed. No pertinent surgical history.  MEDICATIONS:  Prior to Admission medications   Medication Sig Start Date End Date Taking? Authorizing Provider  amitriptyline (ELAVIL) 10 MG tablet Take 10 mg by mouth at bedtime.    [provider]  amLODipine (NORVASC) 5 MG tablet Take 5 mg by mouth daily. 01/27/20   [provider]  amLODipine (NORVASC) 5 MG tablet amlodipine 5 mg tablet 01/18/20   [provider]  diclofenac (FLECTOR) 1.3 % PTCH Flector 1.3 % transdermal 12 hour patch  USE 2 PATCHES TOPICALLY TO AFFECTED AREA EVERY 12 HOURS 07/25/19   [provider]  diclofenac Sodium (VOLTAREN) 1 % GEL diclofenac 1 % topical gel  APPLY 2 GRAMS TO AFFECTED AREA 4 TIMES DAILY AS NEEDED FOR PAIN    [provider]  ibuprofen (ADVIL) 800 MG tablet ibuprofen 800 mg tablet 02/24/18   [provider]  lidocaine (LIDODERM) 5 % lidocaine 5 % topical patch  APPLY 1 PATCH TOPICALLY ONCE DAILY (WEAR UP TO 12 HOURS)    [provider]  meloxicam (MOBIC) 15 MG tablet meloxicam 15 mg tablet  TAKE 1 TABLET BY MOUTH ONCE DAILY    [provider]  metoprolol succinate (TOPROL-XL) 25  MG 24 hr tablet metoprolol succinate ER 25 mg tablet,extended release 24 hr 01/18/20   [provider]  metoprolol tartrate (LOPRESSOR) 25 MG tablet Take 1 tablet (25 mg total) by mouth 2 (two) times daily. 01/18/19 01/18/20  Carrie Mew, MD  predniSONE (STERAPRED UNI-PAK 21 TAB) 10 MG (21) TBPK tablet Take 6 tablets on the first day and decrease by 1 tablet each day until finished. 07/11/21   Victorino Dike, FNP    Physical Exam   Triage Vital Signs: ED Triage Vitals  Enc Vitals Group     BP 06/04/22 0527 (!) 156/84     Pulse Rate 06/04/22 0527 (!) 110     Resp 06/04/22 0527 20     Temp 06/04/22 0527 98.3 F (36.8 C)     Temp Source 06/04/22 0527 Oral     SpO2 06/04/22 0527 98 %     Weight 06/04/22 0524 (!) 327 lb (148.3 kg)     Height 06/04/22 0524 5' 9"$  (1.753 m)     Head Circumference --      Peak Flow --      Pain Score 06/04/22 0524 4     Pain Loc --      Pain Edu? --      Excl. in [place]? --     Most recent vital signs: Vitals:  06/04/22 0527  BP: (!) 156/84  Pulse: (!) 110  Resp: 20  Temp: 98.3 F (36.8 C)  SpO2: 98%    CONSTITUTIONAL: Alert, responds appropriately to questions. Well-appearing; well-nourished HEAD: Normocephalic, atraumatic EYES: Conjunctivae clear, pupils appear equal, sclera nonicteric ENT: normal nose; moist mucous membranes NECK: Supple, normal ROM CARD: RRR; S1 and S2 appreciated RESP: Normal chest excursion without splinting or tachypnea; breath sounds clear and equal bilaterally; no wheezes, no rhonchi, no rales, no hypoxia or respiratory distress, speaking full sentences ABD/GI: Non-distended; soft, non-tender, no rebound, no guarding, no peritoneal signs BACK: The back appears normal EXT: Normal ROM in all joints; no deformity noted, no edema SKIN: Normal color for age and race; warm; no rash on exposed skin NEURO: Moves all extremities equally, normal speech PSYCH: The patient's mood and manner are appropriate.   ED  Results / Procedures / Treatments   LABS: (all labs ordered are listed, but only abnormal results are displayed) Labs Reviewed  CBC - Abnormal; Notable for the following components:      Result Value   WBC 11.3 (*)    Hemoglobin 12.1 (*)    MCH 24.4 (*)    RDW 16.6 (*)    All other components within normal limits  BASIC METABOLIC PANEL - Abnormal; Notable for the following components:   Sodium 134 (*)    Chloride 97 (*)    Calcium 8.7 (*)    All other components within normal limits  D-DIMER, QUANTITATIVE  TROPONIN I (HIGH SENSITIVITY)  TROPONIN I (HIGH SENSITIVITY)     EKG:  EKG Interpretation  Date/Time:  Thursday June 04 2022 05:31:18 EST Ventricular Rate:  100 PR Interval:  126 QRS Duration: 82 QT Interval:  308 QTC Calculation: 397 R Axis:   63 Text Interpretation: Normal sinus rhythm Nonspecific T wave abnormality Abnormal ECG When compared with ECG of 09-Sep-2021 01:36, No significant change was found Confirmed by Pryor Curia [phone]) on 06/04/2022 6:02:57 AM         RADIOLOGY: My personal review and interpretation of imaging: Chest x-ray clear.  I have personally reviewed all radiology reports.   DG Chest 1 View  Result Date: 06/04/2022 CLINICAL DATA:  Steady gait.  Upper chest pain. EXAM: CHEST  1 VIEW COMPARISON:  09/09/2021 FINDINGS: Stable cardiomediastinal contours. Lung volumes are low. No pleural effusion or edema. No airspace opacities. IMPRESSION: Low lung volumes. No acute findings. Electronically Signed   By: Kerby Moors M.D.   On: 06/04/2022 05:39     PROCEDURES:  Critical Care performed: No    Procedures    IMPRESSION / MDM / ASSESSMENT AND PLAN / ED COURSE  I reviewed the triage vital signs and the nursing notes.    Patient here with chest pain, tachycardia.  The patient is on the cardiac monitor to evaluate for evidence of arrhythmia and/or significant heart rate changes.   DIFFERENTIAL DIAGNOSIS (includes but not  limited to):   PE, musculoskeletal pain, pneumonia, pneumothorax, less likely ACS, dissection, CHF   Patient's presentation is most consistent with acute presentation with potential threat to life or bodily function.   PLAN: Will obtain CBC, BMP, troponin x 2, D-dimer, chest x-ray.  Will give Toradol for pain control.   MEDICATIONS GIVEN IN ED: Medications  ketorolac (TORADOL) 30 MG/ML injection 30 mg (30 mg Intravenous Given 06/04/22 0701)     ED COURSE: First troponin negative.  Slight leukocytosis but no infectious symptoms.  Normal hemoglobin.  Normal electrolytes.  Second  troponin, D-dimer pending.  Chest x-ray reviewed and interpreted by myself and the radiologist and shows no acute abnormality.  Signed out to oncoming EDP at the 7 AM to follow-up on repeat troponin and D-dimer.    CONSULTS: Pending further workup   OUTSIDE RECORDS REVIEWED: Reviewed last cardiology note with Dr. Nehemiah Massed on 12/23/2021.       FINAL CLINICAL IMPRESSION(S) / ED DIAGNOSES   Final diagnoses:  Chest wall pain     Rx / DC Orders   ED Discharge Orders     None        Note:  This document was prepared using Dragon voice recognition software and may include unintentional dictation errors.   Oriel Rumbold, Delice Bison, DO 06/04/22 [phone]

## 2022-06-04 NOTE — ED Provider Notes (Signed)
-----------------------------------------   7:12 AM on 06/04/2022 -----------------------------------------  Blood pressure (!) 156/84, pulse (!) 110, temperature 98.3 F (36.8 C), temperature source Oral, resp. rate 20, height 5\' 9"  (1.753 m), weight (!) 148.3 kg, SpO2 98 %.  Assuming care from Dr. Leonides Schanz.  In short, Stephen Bautista is a 37 y.o. male with a chief complaint of Chest Pain .  Refer to the original H&P for additional details.  The current plan of care is to follow-up repeat troponin and d-dimer, if negative suspect MSK chest pain.  ----------------------------------------- 7:47 AM on 06/04/2022 ----------------------------------------- Repeat troponin within normal limits, D-dimer is also unremarkable.  Patient ports feeling better following pain medication and he is appropriate for discharge home with PCP follow-up for suspected musculoskeletal chest pain.  He was counseled to return to the ED for new or worsening symptoms, patient agrees with plan.    Blake Divine, MD 06/04/22 [phone]

## 2022-08-10 ENCOUNTER — Encounter: Payer: Self-pay | Admitting: Emergency Medicine

## 2022-08-10 ENCOUNTER — Emergency Department
Admission: EM | Admit: 2022-08-10 | Discharge: 2022-08-11 | Disposition: A | Discharge status: Home / Self Care | Payer: Medicaid Other | Attending: Emergency Medicine | Admitting: Emergency Medicine

## 2022-08-10 ENCOUNTER — Other Ambulatory Visit: Payer: Self-pay

## 2022-08-10 DIAGNOSIS — Z79899 Other long term (current) drug therapy: Secondary | ICD-10-CM | POA: Diagnosis not present

## 2022-08-10 DIAGNOSIS — R1033 Periumbilical pain: Secondary | ICD-10-CM | POA: Diagnosis not present

## 2022-08-10 DIAGNOSIS — R197 Diarrhea, unspecified: Secondary | ICD-10-CM | POA: Insufficient documentation

## 2022-08-10 DIAGNOSIS — R112 Nausea with vomiting, unspecified: Secondary | ICD-10-CM | POA: Diagnosis present

## 2022-08-10 DIAGNOSIS — K0889 Other specified disorders of teeth and supporting structures: Secondary | ICD-10-CM | POA: Diagnosis not present

## 2022-08-10 DIAGNOSIS — I10 Essential (primary) hypertension: Secondary | ICD-10-CM | POA: Diagnosis not present

## 2022-08-10 NOTE — ED Triage Notes (Signed)
Patient ambulatory to triage with steady gait, without difficulty or distress noted; pt reports lower abd pain accomp by N/V/D since Friday along with rt upper dental pain

## 2022-08-10 NOTE — ED Provider Notes (Signed)
[facility] Provider Note    Event Date/Time   First MD Initiated Contact with Patient 08/10/22 2345     (approximate)   History   Abdominal Pain   HPI  Stephen Bautista is a 37 y.o. male who presents to the ED from home with a chief complaint of abdominal pain, nausea/vomiting/diarrhea x 4 days.  Presents tonight because today he developed right upper dental pain and he "cannot handle both".  Denies associated fever/chills, cough, chest pain, shortness of breath, dysuria, testicular pain or swelling.  Denies recent travel, camping or antibiotic use.     Past Medical History   Past Medical History:  Diagnosis Date   Anxiety    Chronic back pain    Depression    Hypertension    Skull fracture    Tachycardia      Active Problem List   Patient Active Problem List   Diagnosis Date Noted   Sleep disturbance 02/05/2020   HTN (hypertension) 02/05/2020   Benign essential HTN 08/04/2019   OSA (obstructive sleep apnea) 08/04/2019     Past Surgical History  History reviewed. No pertinent surgical history.   Home Medications   Prior to Admission medications   Medication Sig Start Date End Date Taking? Authorizing Provider  ciprofloxacin (CIPRO) 500 MG tablet Take 1 tablet (500 mg total) by mouth 2 (two) times daily for 3 days. 08/11/22 08/14/22 Yes Irean Hong, MD  dicyclomine (BENTYL) 20 MG tablet Take 1 tablet (20 mg total) by mouth every 6 (six) hours. 08/11/22  Yes Irean Hong, MD  ondansetron (ZOFRAN-ODT) 4 MG disintegrating tablet Take 1 tablet (4 mg total) by mouth every 8 (eight) hours as needed for nausea or vomiting. 08/11/22  Yes Irean Hong, MD  amitriptyline (ELAVIL) 10 MG tablet Take 10 mg by mouth at bedtime.    [provider]  amLODipine (NORVASC) 5 MG tablet Take 5 mg by mouth daily. 01/27/20   [provider]  amLODipine (NORVASC) 5 MG tablet amlodipine 5 mg tablet 01/18/20   [provider]   diclofenac (FLECTOR) 1.3 % PTCH Flector 1.3 % transdermal 12 hour patch  USE 2 PATCHES TOPICALLY TO AFFECTED AREA EVERY 12 HOURS 07/25/19   [provider]  diclofenac Sodium (VOLTAREN) 1 % GEL diclofenac 1 % topical gel  APPLY 2 GRAMS TO AFFECTED AREA 4 TIMES DAILY AS NEEDED FOR PAIN    [provider]  ibuprofen (ADVIL) 800 MG tablet ibuprofen 800 mg tablet 02/24/18   [provider]  lidocaine (LIDODERM) 5 % Place 1 patch onto the skin every 12 (twelve) hours. Remove & Discard patch within 12 hours or as directed by MD 06/04/22 06/04/23  Chesley Noon, MD  meloxicam (MOBIC) 15 MG tablet meloxicam 15 mg tablet  TAKE 1 TABLET BY MOUTH ONCE DAILY    [provider]  metoprolol succinate (TOPROL-XL) 25 MG 24 hr tablet metoprolol succinate ER 25 mg tablet,extended release 24 hr 01/18/20   [provider]  metoprolol tartrate (LOPRESSOR) 25 MG tablet Take 1 tablet (25 mg total) by mouth 2 (two) times daily. 01/18/19 01/18/20  Sharman Cheek, MD  predniSONE (STERAPRED UNI-PAK 21 TAB) 10 MG (21) TBPK tablet Take 6 tablets on the first day and decrease by 1 tablet each day until finished. 07/11/21   Chinita Pester, FNP     Allergies  Patient has no known allergies.   Family History  History reviewed. No pertinent family history.  Physical Exam  Triage Vital Signs: ED Triage Vitals  Enc Vitals Group     BP 08/10/22 2341 (!) 155/86     Pulse Rate 08/10/22 2341 88     Resp 08/10/22 2341 18     Temp 08/10/22 2341 98.3 F (36.8 C)     Temp Source 08/10/22 2341 Oral     SpO2 08/10/22 2341 100 %     Weight 08/10/22 2340 (!) 350 lb (158.8 kg)     Height 08/10/22 2340  (1.753 m)     Head Circumference --      Peak Flow --      Pain Score 08/10/22 2340 9     Pain Loc --      Pain Edu? --      Excl. in GC? --     Updated Vital Signs: BP (!) 155/86 (BP Location: Left Arm)   Pulse 88   Temp 98.3 F (36.8 C) (Oral)   Resp 18   Ht   (1.753 m) Comment: Simultaneous filing. User may not have seen previous data.  Wt (!) 158.8 kg Comment: Simultaneous filing. User may not have seen previous data.  SpO2 100%   BMI 51.69 kg/m    General: Awake, mild distress.  CV:  RRR.  Good peripheral perfusion.  Resp:  Normal effort.  CTAB. Abd:  Mildly tender to palpation to umbilicus and right lower quadrant without rebound or guarding.  No truncal vesicles.  No distention.  Other:  Normal male genitalia without pain or swelling to testicles.  Right upper posterior molar tenderness to percussion with tongue blade, cracked tooth noted.  No abscess.   ED Results / Procedures / Treatments  Labs (all labs ordered are listed, but only abnormal results are displayed) Labs Reviewed  CBC WITH DIFFERENTIAL/PLATELET - Abnormal; Notable for the following components:      Result Value   Hemoglobin 12.0 (*)    MCV 78.3 (*)    MCH 23.4 (*)    MCHC 29.9 (*)    RDW 15.7 (*)    All other components within normal limits  COMPREHENSIVE METABOLIC PANEL - Abnormal; Notable for the following components:   Potassium 3.4 (*)    Glucose, Bld 104 (*)    Calcium 8.4 (*)    All other components within normal limits  URINALYSIS, ROUTINE W REFLEX MICROSCOPIC - Abnormal; Notable for the following components:   Color, Urine YELLOW (*)    APPearance HAZY (*)    All other components within normal limits  C DIFFICILE QUICK SCREEN W PCR REFLEX    GASTROINTESTINAL PANEL BY PCR, STOOL (REPLACES STOOL CULTURE)  LIPASE, BLOOD     EKG  None   RADIOLOGY I have independently visualized and interpreted patient's CT scan as well as noted the radiology interpretation:  CT abdomen/pelvis: No acute abnormality  Official radiology report(s): CT ABDOMEN PELVIS W CONTRAST  Result Date: 08/11/2022 CLINICAL DATA:  Right lower quadrant pain EXAM: CT ABDOMEN AND PELVIS WITH CONTRAST TECHNIQUE: Multidetector CT imaging of the abdomen and pelvis was performed using  the standard protocol following bolus administration of intravenous contrast. RADIATION DOSE REDUCTION: This exam was performed according to the departmental dose-optimization program which includes automated exposure control, adjustment of the mA and/or kV according to patient size and/or use of iterative reconstruction technique. CONTRAST:  OMNIPAQUE IOHEXOL 350 MG/ML SOLN COMPARISON:  None Available. FINDINGS: Lower chest: No acute abnormality Hepatobiliary: No focal hepatic abnormality. Gallbladder unremarkable. Pancreas: No  focal abnormality or ductal dilatation. Spleen: No focal abnormality.  Normal size. Adrenals/Urinary Tract: Adrenal glands are normal. No stones or hydronephrosis. 1.6 cm cyst in the lower pole of the left kidney. No follow-up imaging recommended. Urinary bladder unremarkable. Stomach/Bowel: Normal appendix. Stomach, large and small bowel grossly unremarkable. Vascular/Lymphatic: No evidence of aneurysm or adenopathy. Reproductive: No visible focal abnormality. Other: No free fluid or free air. Musculoskeletal: No acute bony abnormality. IMPRESSION: No acute findings in the abdomen or pelvis. Electronically Signed   By: Charlett Nose M.D.   On: 08/11/2022 00:56     PROCEDURES:  Critical Care performed: No  Procedures   MEDICATIONS ORDERED IN ED: Medications  ciprofloxacin (CIPRO) tablet 500 mg (has no administration in time range)  sodium chloride 0.9 % bolus 1,000 mL (1,000 mLs Intravenous New Bag/Given 08/11/22 0024)  ondansetron (ZOFRAN) injection 4 mg (4 mg Intravenous Given 08/11/22 0025)  morphine (PF) 4 MG/ML injection 4 mg (4 mg Intravenous Given 08/11/22 0025)  iohexol (OMNIPAQUE) 350 MG/ML injection 100 mL (100 mLs Intravenous Contrast Given 08/11/22 0044)     IMPRESSION / MDM / ASSESSMENT AND PLAN / ED COURSE  I reviewed the triage vital signs and the nursing notes.                             36 year old male presenting with abdominal pain,  nausea/vomiting/diarrhea and dentalgia. Differential diagnosis includes, but is not limited to, acute appendicitis, renal colic, testicular torsion, urinary tract infection/pyelonephritis, prostatitis,  epididymitis, diverticulitis, small bowel obstruction or ileus, colitis, abdominal aortic aneurysm, gastroenteritis, hernia, etc. I have personally reviewed patient's records and note an urgent care visit from 07/13/2022 for URI.  Patient's presentation is most consistent with acute presentation with potential threat to life or bodily function.  Will obtain lab work, UA.  Proceed with CT abdomen/pelvis.  Collect stool studies if patient can provide specimen.  Administer IV fluid hydration, IV morphine for pain.  With IV Zofran for nausea.  Will reassess.  Clinical Course as of 08/11/22 0113  Tue Aug 11, 2022  0112 Updated patient and family member on all test results.  No emesis or diarrhea while in the emergency department.  Will empirically treat with a few days of Cipro.  Discharge home with as needed Bentyl and Zofran.  Strict return precautions given.  Both verbalized understanding and agree with plan of care. [JS]    Clinical Course User Index [JS] Irean Hong, MD     FINAL CLINICAL IMPRESSION(S) / ED DIAGNOSES   Final diagnoses:  Periumbilical abdominal pain  Nausea vomiting and diarrhea  Dentalgia     Rx / DC Orders   ED Discharge Orders          Ordered    ciprofloxacin (CIPRO) 500 MG tablet  2 times daily        08/11/22 0111    dicyclomine (BENTYL) 20 MG tablet  Every 6 hours        08/11/22 0111    ondansetron (ZOFRAN-ODT) 4 MG disintegrating tablet  Every 8 hours PRN        08/11/22 0111             Note:  This document was prepared using Dragon voice recognition software and may include unintentional dictation errors.   Irean Hong, MD 08/11/22 [phone]

## 2022-08-10 NOTE — ED Provider Notes (Incomplete)
[facility] Provider Note    Event Date/Time   First MD Initiated Contact with Patient 08/10/22 2345     (approximate)   History   Abdominal Pain   HPI  Stephen Bautista is a 37 y.o. male  ***       Past Medical History   Past Medical History:  Diagnosis Date  . Anxiety   . Chronic back pain   . Depression   . Hypertension   . Skull fracture   . Tachycardia      Active Problem List   Patient Active Problem List   Diagnosis Date Noted  . Sleep disturbance 02/05/2020  . HTN (hypertension) 02/05/2020  . Benign essential HTN 08/04/2019  . OSA (obstructive sleep apnea) 08/04/2019     Past Surgical History  History reviewed. No pertinent surgical history.   Home Medications   Prior to Admission medications   Medication Sig Start Date End Date Taking? Authorizing Provider  amitriptyline (ELAVIL) 10 MG tablet Take 10 mg by mouth at bedtime.    [provider]  amLODipine (NORVASC) 5 MG tablet Take 5 mg by mouth daily. 01/27/20   [provider]  amLODipine (NORVASC) 5 MG tablet amlodipine 5 mg tablet 01/18/20   [provider]  diclofenac (FLECTOR) 1.3 % PTCH Flector 1.3 % transdermal 12 hour patch  USE 2 PATCHES TOPICALLY TO AFFECTED AREA EVERY 12 HOURS 07/25/19   [provider]  diclofenac Sodium (VOLTAREN) 1 % GEL diclofenac 1 % topical gel  APPLY 2 GRAMS TO AFFECTED AREA 4 TIMES DAILY AS NEEDED FOR PAIN    [provider]  ibuprofen (ADVIL) 800 MG tablet ibuprofen 800 mg tablet 02/24/18   [provider]  lidocaine (LIDODERM) 5 % Place 1 patch onto the skin every 12 (twelve) hours. Remove & Discard patch within 12 hours or as directed by MD 06/04/22 06/04/23  Chesley Noon, MD  meloxicam (MOBIC) 15 MG tablet meloxicam 15 mg tablet  TAKE 1 TABLET BY MOUTH ONCE DAILY    [provider]  metoprolol succinate (TOPROL-XL) 25 MG 24 hr tablet metoprolol succinate ER 25 mg  tablet,extended release 24 hr 01/18/20   [provider]  metoprolol tartrate (LOPRESSOR) 25 MG tablet Take 1 tablet (25 mg total) by mouth 2 (two) times daily. 01/18/19 01/18/20  Sharman Cheek, MD  predniSONE (STERAPRED UNI-PAK 21 TAB) 10 MG (21) TBPK tablet Take 6 tablets on the first day and decrease by 1 tablet each day until finished. 07/11/21   Chinita Pester, FNP     Allergies  Patient has no known allergies.   Family History  History reviewed. No pertinent family history.   Physical Exam  Triage Vital Signs: ED Triage Vitals  Enc Vitals Group     BP 08/10/22 2341 (!) 155/86     Pulse Rate 08/10/22 2341 88     Resp 08/10/22 2341 18     Temp 08/10/22 2341 98.3 F (36.8 C)     Temp Source 08/10/22 2341 Oral     SpO2 08/10/22 2341 100 %     Weight 08/10/22 2340 (!) 350 lb (158.8 kg)     Height 08/10/22 2340  (1.753 m)     Head Circumference --      Peak Flow --      Pain Score 08/10/22 2340 9     Pain Loc --      Pain Edu? --  Excl. in GC? --     Updated Vital Signs: BP (!) 155/86 (BP Location: Left Arm)   Pulse 88   Temp 98.3 F (36.8 C) (Oral)   Resp 18   Ht  (1.753 m) Comment: Simultaneous filing. User may not have seen previous data.  Wt (!) 158.8 kg Comment: Simultaneous filing. User may not have seen previous data.  SpO2 100%   BMI 51.69 kg/m   {Only need to document appropriate and relevant physical exam:1} General: Awake, no distress. *** CV:  Good peripheral perfusion. *** Resp:  Normal effort. *** Abd:  No distention. *** Other:  ***   ED Results / Procedures / Treatments  Labs (all labs ordered are listed, but only abnormal results are displayed) Labs Reviewed  C DIFFICILE QUICK SCREEN W PCR REFLEX    GASTROINTESTINAL PANEL BY PCR, STOOL (REPLACES STOOL CULTURE)  CBC WITH DIFFERENTIAL/PLATELET  COMPREHENSIVE METABOLIC PANEL  LIPASE, BLOOD  URINALYSIS, ROUTINE W REFLEX MICROSCOPIC      EKG  ***   RADIOLOGY *** {You MUST document your own interpretation of imaging, as well as the fact that you reviewed the radiologist's report!:1}  Official radiology report(s): No results found.   PROCEDURES:  Critical Care performed: {CriticalCareYesNo:19197::"Yes, see critical care procedure note(s)","No"}  Procedures   MEDICATIONS ORDERED IN ED: Medications  sodium chloride 0.9 % bolus 1,000 mL (has no administration in time range)  ondansetron (ZOFRAN) injection 4 mg (has no administration in time range)  morphine (PF) 4 MG/ML injection 4 mg (has no administration in time range)     IMPRESSION / MDM / ASSESSMENT AND PLAN / ED COURSE  I reviewed the triage vital signs and the nursing notes.                              Differential diagnosis includes, but is not limited to, ***  Patient's presentation is most consistent with {EM COPA:27473}  {If the patient is on the monitor, remove the brackets and asterisks on the sentence below and remember to document it as a Procedure as well. Otherwise delete the sentence below:1} {**The patient is on the cardiac monitor to evaluate for evidence of arrhythmia and/or significant heart rate changes.**}  {Remember to include, when applicable, any/all of the following data: independent review of imaging independent review of labs (comment specifically on pertinent positives and negatives) review of specific prior hospitalizations, PCP/specialist notes, etc. discuss meds given and prescribed document any discussion with consultants (including hospitalists) any clinical decision tools you used and why (PECARN, NEXUS, etc.) did you consider admitting the patient? document social determinants of health affecting patient's care (homelessness, inability to follow up in a timely fashion, etc) document any pre-existing conditions increasing risk on current visit (e.g. diabetes and HTN increasing danger of high-risk chest  pain/ACS) describes what meds you gave (especially parenteral) and why any other interventions?:1}      FINAL CLINICAL IMPRESSION(S) / ED DIAGNOSES   Final diagnoses:  Periumbilical abdominal pain  Nausea vomiting and diarrhea     Rx / DC Orders   ED Discharge Orders     None        Note:  This document was prepared using Dragon voice recognition software and may include unintentional dictation errors.

## 2022-08-10 NOTE — ED Notes (Signed)
ED Provider at bedside. 

## 2022-08-11 ENCOUNTER — Emergency Department: Payer: Medicaid Other

## 2022-08-11 LAB — COMPREHENSIVE METABOLIC PANEL
ALT: 23 U/L (ref 0–44)
AST: 21 U/L (ref 15–41)
Albumin: 3.6 g/dL (ref 3.5–5.0)
Alkaline Phosphatase: 67 U/L (ref 38–126)
Anion gap: 7 (ref 5–15)
BUN: 9 mg/dL (ref 6–20)
CO2: 26 mmol/L (ref 22–32)
Calcium: 8.4 mg/dL — ABNORMAL LOW (ref 8.9–10.3)
Chloride: 102 mmol/L (ref 98–111)
Creatinine, Ser: 0.65 mg/dL (ref 0.61–1.24)
GFR, Estimated: >60 mL/min (ref >60)
Glucose, Bld: 104 mg/dL — ABNORMAL HIGH (ref 70–99)
Potassium: 3.4 mmol/L — ABNORMAL LOW (ref 3.5–5.1)
Sodium: 135 mmol/L (ref 135–145)
Total Bilirubin: 0.4 mg/dL (ref 0.3–1.2)
Total Protein: 7.9 g/dL (ref 6.5–8.1)

## 2022-08-11 LAB — CBC WITH DIFFERENTIAL/PLATELET
Abs Immature Granulocytes: 0.05 10*3/uL (ref 0.00–0.07)
Basophils Absolute: 0 10*3/uL (ref 0.0–0.1)
Basophils Relative: 0 %
Eosinophils Absolute: 0.4 10*3/uL (ref 0.0–0.5)
Eosinophils Relative: 4 %
HCT: 40.1 % (ref 39.0–52.0)
Hemoglobin: 12 g/dL — ABNORMAL LOW (ref 13.0–17.0)
Immature Granulocytes: 1 %
Lymphocytes Relative: 32 %
Lymphs Abs: 3 10*3/uL (ref 0.7–4.0)
MCH: 23.4 pg — ABNORMAL LOW (ref 26.0–34.0)
MCHC: 29.9 g/dL — ABNORMAL LOW (ref 30.0–36.0)
MCV: 78.3 fL — ABNORMAL LOW (ref 80.0–100.0)
Monocytes Absolute: 0.7 10*3/uL (ref 0.1–1.0)
Monocytes Relative: 8 %
Neutro Abs: 5.1 10*3/uL (ref 1.7–7.7)
Neutrophils Relative %: 55 %
Platelets: 324 10*3/uL (ref 150–400)
RBC: 5.12 MIL/uL (ref 4.22–5.81)
RDW: 15.7 % — ABNORMAL HIGH (ref 11.5–15.5)
WBC: 9.2 10*3/uL (ref 4.0–10.5)
nRBC: 0 % (ref 0.0–0.2)

## 2022-08-11 LAB — URINALYSIS, ROUTINE W REFLEX MICROSCOPIC
Bilirubin Urine: NEGATIVE
Glucose, UA: NEGATIVE mg/dL
Hgb urine dipstick: NEGATIVE
Ketones, ur: NEGATIVE mg/dL
Leukocytes,Ua: NEGATIVE
Nitrite: NEGATIVE
Protein, ur: NEGATIVE mg/dL
Specific Gravity, Urine: 1.024 (ref 1.005–1.030)
pH: 5 (ref 5.0–8.0)

## 2022-08-11 LAB — LIPASE, BLOOD: Lipase: 34 U/L (ref 11–51)

## 2022-08-11 MED ORDER — SODIUM CHLORIDE 0.9 % IV BOLUS
1000.0000 mL | Freq: Once | INTRAVENOUS | Status: AC
  Administered 2022-08-11: 1000 mL via INTRAVENOUS

## 2022-08-11 MED ORDER — ONDANSETRON HCL 4 MG/2ML IJ SOLN
4.0000 mg | Freq: Once | INTRAMUSCULAR | Status: AC
  Administered 2022-08-11: 4 mg via INTRAVENOUS
  Filled 2022-08-11: qty 2

## 2022-08-11 MED ORDER — CIPROFLOXACIN HCL 500 MG PO TABS: 500.0000 mg | ORAL_TABLET | Freq: Two times a day (BID) | ORAL | 0 refills | Status: AC

## 2022-08-11 MED ORDER — IOHEXOL 350 MG/ML SOLN
100.0000 mL | Freq: Once | INTRAVENOUS | Status: AC | PRN
  Administered 2022-08-11: 100 mL via INTRAVENOUS

## 2022-08-11 MED ORDER — CIPROFLOXACIN HCL 500 MG PO TABS
500.0000 mg | ORAL_TABLET | Freq: Once | ORAL | Status: AC
  Administered 2022-08-11: 500 mg via ORAL
  Filled 2022-08-11: qty 1

## 2022-08-11 MED ORDER — ONDANSETRON 4 MG PO TBDP: 4.0000 mg | ORAL_TABLET | Freq: Three times a day (TID) | ORAL | 0 refills | Status: DC | PRN

## 2022-08-11 MED ORDER — MORPHINE SULFATE (PF) 4 MG/ML IV SOLN
4.0000 mg | Freq: Once | INTRAVENOUS | Status: AC
  Administered 2022-08-11: 4 mg via INTRAVENOUS
  Filled 2022-08-11: qty 1

## 2022-08-11 MED ORDER — DICYCLOMINE HCL 20 MG PO TABS: 20.0000 mg | ORAL_TABLET | Freq: Four times a day (QID) | ORAL | 0 refills | Status: DC

## 2022-08-11 NOTE — Discharge Instructions (Signed)
1.  Take and finish antibiotic as prescribed (Cipro 500 mg twice daily x 3 days). 2.  You may take medicines as needed for abdominal discomfort and nausea (Bentyl/Zofran #20). 3.  Clear liquids x 24 hours, then BRAT diet x 3 days, then slowly advance diet as tolerated. 4.  Return to the ER for worsening symptoms, persistent vomiting, difficulty breathing or other concerns.

## 2023-03-22 ENCOUNTER — Other Ambulatory Visit: Payer: Self-pay

## 2023-03-22 ENCOUNTER — Emergency Department
Admission: EM | Admit: 2023-03-22 | Discharge: 2023-03-22 | Disposition: A | Discharge status: Home / Self Care | Payer: BC Managed Care – PPO | Attending: Emergency Medicine | Admitting: Emergency Medicine

## 2023-03-22 DIAGNOSIS — M791 Myalgia, unspecified site: Secondary | ICD-10-CM | POA: Diagnosis not present

## 2023-03-22 DIAGNOSIS — J069 Acute upper respiratory infection, unspecified: Secondary | ICD-10-CM | POA: Insufficient documentation

## 2023-03-22 DIAGNOSIS — Z20822 Contact with and (suspected) exposure to covid-19: Secondary | ICD-10-CM | POA: Insufficient documentation

## 2023-03-22 DIAGNOSIS — R059 Cough, unspecified: Secondary | ICD-10-CM | POA: Diagnosis present

## 2023-03-22 DIAGNOSIS — I10 Essential (primary) hypertension: Secondary | ICD-10-CM | POA: Insufficient documentation

## 2023-03-22 LAB — SARS CORONAVIRUS 2 BY RT PCR: SARS Coronavirus 2 by RT PCR: NEGATIVE

## 2023-03-22 MED ORDER — AMOXICILLIN 500 MG PO CAPS
500.0000 mg | ORAL_CAPSULE | Freq: Once | ORAL | Status: AC
  Administered 2023-03-22: 500 mg via ORAL
  Filled 2023-03-22: qty 1

## 2023-03-22 MED ORDER — AMOXICILLIN 875 MG PO TABS: 875.0000 mg | ORAL_TABLET | Freq: Two times a day (BID) | ORAL | 0 refills | Status: DC

## 2023-03-22 NOTE — ED Triage Notes (Signed)
Pt to ED for cough, body aches started Friday night.

## 2023-03-22 NOTE — ED Provider Notes (Signed)
[facility] Provider Note    Event Date/Time   First MD Initiated Contact with Patient 03/22/23 2042     (approximate)   History   Cough   HPI  Stephen Bautista is a 37 y.o. male with history of hypertension, tachycardia, presents emergency department with complaints of cough, body aches that started on Friday.  Unsure if he has had fevers he is continue to take Robitussin that has fever reducer in it.  No vomiting or diarrhea.  No chest pain shortness of breath.  Cough is productive with yellow mucus      Physical Exam   Triage Vital Signs: ED Triage Vitals  Encounter Vitals Group     BP 03/22/23 1755 (!) 159/87     Systolic BP Percentile --      Diastolic BP Percentile --      Pulse Rate 03/22/23 1754 (!) 108     Resp 03/22/23 1754 20     Temp 03/22/23 1754 98.6 F (37 C)     Temp src --      SpO2 03/22/23 1754 96 %     Weight 03/22/23 1754 (!) 325 lb (147.4 kg)     Height 03/22/23 1754 5\' 10"  (1.778 m)     Head Circumference --      Peak Flow --      Pain Score 03/22/23 1754 4     Pain Loc --      Pain Education --      Exclude from Growth Chart --     Most recent vital signs: Vitals:   03/22/23 1754 03/22/23 1755  BP:  (!) 159/87  Pulse: (!) 108   Resp: 20   Temp: 98.6 F (37 C)   SpO2: 96%      General: Awake, no distress.   CV:  Good peripheral perfusion. regular rate and  rhythm Resp:  Normal effort. Lungs cta Abd:  No distention.   Other:      ED Results / Procedures / Treatments   Labs (all labs ordered are listed, but only abnormal results are displayed) Labs Reviewed  SARS CORONAVIRUS 2 BY RT PCR     EKG     RADIOLOGY     PROCEDURES:   Procedures   MEDICATIONS ORDERED IN ED: Medications  amoxicillin (AMOXIL) capsule 500 mg (has no administration in time range)     IMPRESSION / MDM / ASSESSMENT AND PLAN / ED COURSE  I reviewed the triage vital signs and the nursing notes.                               Differential diagnosis includes, but is not limited to, COVID, influenza, CAP, acute bronchitis, acute viral URI  Patient's presentation is most consistent with acute illness / injury with system symptoms.   Patient does have productive cough with yellow mucus consistently throughout the day, will him place him on amoxicillin.  He is to follow-up with his regular doctor if not improving to 3 days.  Dose of Amoxil given in the ED.  He is to return emergency department worsening.  Discharged stable condition with a work note.      FINAL CLINICAL IMPRESSION(S) / ED DIAGNOSES   Final diagnoses:  Acute URI     Rx / DC Orders   ED Discharge Orders          Ordered    amoxicillin (AMOXIL)  875 MG tablet  2 times daily        03/22/23 2048             Note:  This document was prepared using Dragon voice recognition software and may include unintentional dictation errors.    Faythe Ghee, PA-C 03/22/23 2051    Merwyn Katos, MD 03/22/23 2113

## 2023-06-21 ENCOUNTER — Ambulatory Visit (INDEPENDENT_AMBULATORY_CARE_PROVIDER_SITE_OTHER): Payer: Medicaid Other | Admitting: Internal Medicine

## 2023-06-21 VITALS — BP 162/99 | HR 77 | Resp 20 | Ht 70.0 in | Wt 340.6 lb

## 2023-06-21 DIAGNOSIS — G4733 Obstructive sleep apnea (adult) (pediatric): Secondary | ICD-10-CM | POA: Diagnosis not present

## 2023-06-21 DIAGNOSIS — I10 Essential (primary) hypertension: Secondary | ICD-10-CM

## 2023-06-21 DIAGNOSIS — Z7189 Other specified counseling: Secondary | ICD-10-CM | POA: Insufficient documentation

## 2023-06-21 NOTE — Patient Instructions (Signed)

## 2023-06-21 NOTE — Progress Notes (Signed)
 [facility] [address] [place], [place] [address]  Pulmonary Sleep Medicine   Office Visit Note  Patient Name: Stephen Bautista DOB: 1986-02-27 MRN [id number]    Chief Complaint: Obstructive Sleep Apnea visit  Brief History:  Maxum is seen today for an annual follow up visit for CPAP@ 13 cmH2O. The patient has a 3 year history of sleep apnea. Patient is using PAP nightly.  The patient feels somewhat rested after sleeping with PAP.  The patient reports benefit from PAP use. Reported sleepiness is  improved and the Epworth Sleepiness Score is 14 out of 24. The patient does take naps daily with CPAP 1-2 hrs. The patient complains of the following: mask worn out and needs a replacement. Mask leak.   The compliance download shows 90% compliance with an average use time of 6 hours 40 minutes. The AHI is 0.3.  The patient does not complain of limb movements disrupting sleep. The patient continues to require PAP therapy in order to eliminate sleep apnea.   ROS  General: (-) fever, (-) chills, (-) night sweat Nose and Sinuses: (-) nasal stuffiness or itchiness, (-) postnasal drip, (-) nosebleeds, (-) sinus trouble. Mouth and Throat: (-) sore throat, (-) hoarseness. Neck: (-) swollen glands, (-) enlarged thyroid, (-) neck pain. Respiratory: - cough, - shortness of breath, - wheezing. Neurologic: + numbness, + tingling. Psychiatric: + anxiety, - depression   Current Medication: Outpatient Encounter Medications as of 06/21/2023  Medication Sig   busPIRone (BUSPAR) 5 MG tablet Take 5 mg by mouth 2 (two) times daily.   celecoxib (CELEBREX) 200 MG capsule Take 200 mg by mouth daily.   gabapentin (NEURONTIN) 300 MG capsule Take 300 mg by mouth 3 (three) times daily.   metoprolol succinate (TOPROL-XL) 25 MG 24 hr tablet metoprolol succinate ER 25 mg tablet,extended release 24 hr   telmisartan (MICARDIS) 40 MG tablet Take 40 mg by mouth daily.   [DISCONTINUED] amitriptyline  (ELAVIL) 10 MG tablet Take 10 mg by mouth at bedtime.   [DISCONTINUED] amLODipine (NORVASC) 5 MG tablet Take 5 mg by mouth daily.   [DISCONTINUED] amLODipine (NORVASC) 5 MG tablet amlodipine 5 mg tablet   [DISCONTINUED] amoxicillin (AMOXIL) 875 MG tablet Take 1 tablet (875 mg total) by mouth 2 (two) times daily.   [DISCONTINUED] diclofenac (FLECTOR) 1.3 % PTCH Flector 1.3 % transdermal 12 hour patch  USE 2 PATCHES TOPICALLY TO AFFECTED AREA EVERY 12 HOURS   [DISCONTINUED] diclofenac Sodium (VOLTAREN) 1 % GEL diclofenac 1 % topical gel  APPLY 2 GRAMS TO AFFECTED AREA 4 TIMES DAILY AS NEEDED FOR PAIN   [DISCONTINUED] dicyclomine (BENTYL) 20 MG tablet Take 1 tablet (20 mg total) by mouth every 6 (six) hours.   [DISCONTINUED] ibuprofen (ADVIL) 800 MG tablet ibuprofen 800 mg tablet   [DISCONTINUED] meloxicam (MOBIC) 15 MG tablet meloxicam 15 mg tablet  TAKE 1 TABLET BY MOUTH ONCE DAILY   [DISCONTINUED] metoprolol tartrate (LOPRESSOR) 25 MG tablet Take 1 tablet (25 mg total) by mouth 2 (two) times daily.   [DISCONTINUED] ondansetron (ZOFRAN-ODT) 4 MG disintegrating tablet Take 1 tablet (4 mg total) by mouth every 8 (eight) hours as needed for nausea or vomiting.   [DISCONTINUED] predniSONE (STERAPRED UNI-PAK 21 TAB) 10 MG (21) TBPK tablet Take 6 tablets on the first day and decrease by 1 tablet each day until finished.   No facility-administered encounter medications on file as of 06/21/2023.    Surgical History: History reviewed. No pertinent surgical history.  Medical History:  Past Medical History:  Diagnosis Date   Anxiety    Chronic back pain    Depression    Hypertension    Skull fracture (HCC)    Tachycardia     Family History: Non contributory to the present illness  Social History: Social History   Socioeconomic History   Marital status: Married    Spouse name: Not on file   Number of children: Not on file   Years of education: Not on file   Highest education level: Not on  file  Occupational History   Not on file  Tobacco Use   Smoking status: Never   Smokeless tobacco: Never  Vaping Use   Vaping status: Not on file  Substance and Sexual Activity   Alcohol use: No    Alcohol/week: 0.0 standard drinks of alcohol   Drug use: No   Sexual activity: Not on file  Other Topics Concern   Not on file  Social History Narrative   Not on file   Social Drivers of Health   Financial Resource Strain: Not on file  Food Insecurity: Not on file  Transportation Needs: Not on file  Physical Activity: Not on file  Stress: Not on file  Social Connections: Not on file  Intimate Partner Violence: Not on file    Vital Signs: Blood pressure (!) 162/99, pulse 77, resp. rate 20, height 5\' 10"  (1.778 m), weight (!) 340 lb 9.6 oz (154.5 kg), SpO2 97%. Body mass index is 48.87 kg/m.    Examination: General Appearance: The patient is well-developed, well-nourished, and in no distress. Neck Circumference: 49 cm Skin: Gross inspection of skin unremarkable. Head: normocephalic, no gross deformities. Eyes: no gross deformities noted. ENT: ears appear grossly normal Neurologic: Alert and oriented. No involuntary movements.  STOP BANG RISK ASSESSMENT S (snore) Have you been told that you snore?     No   T (tired) Are you often tired, fatigued, or sleepy during the day?   NO  O (obstruction) Do you stop breathing, choke, or gasp during sleep? NO   P (pressure) Do you have or are you being treated for high blood pressure? YES   B (BMI) Is your body index greater than 35 kg/m? YES   A (age) Are you 2 years old or older? NO   N (neck) Do you have a neck circumference greater than 16 inches?   YES   G (gender) Are you a male? YES   TOTAL STOP/BANG "YES" ANSWERS 4       A STOP-Bang score of 2 or less is considered low risk, and a score of 5 or more is high risk for having either moderate or severe OSA. For people who score 3 or 4, doctors may need to perform  further assessment to determine how likely they are to have OSA.         EPWORTH SLEEPINESS SCALE:  Scale:  (0)= no chance of dozing; (1)= slight chance of dozing; (2)= moderate chance of dozing; (3)= high chance of dozing  Chance  Situtation    Sitting and reading: 3    Watching TV: 2    Sitting Inactive in public: 1    As a passenger in car: 2      Lying down to rest: 3    Sitting and talking: 1    Sitting quielty after lunch: 2    In a car, stopped in traffic: 0   TOTAL SCORE:   14 out of 24  SLEEP STUDIES:  PSG - 03/06/20 - AHI 18.6cmH20, REM AHI 79.2, min Sp02 74% Titration - 03/25/20 - CPAP @ 13 cmH20   CPAP COMPLIANCE DATA:  Date Range: 06/17/2022-06/16/2023  Average Daily Use: 6 hours 40 minutes  Median Use: 6 hours 44 minutes  Compliance for > 4 Hours: 90%  AHI: 0.3 respiratory events per hour  Days Used: 351/365 days  Mask Leak: 39.8  95th Percentile Pressure: 13         LABS: No results found for this or any previous visit (from the past 2160 hours).  Radiology: No results found.  No results found.  No results found.    Assessment and Plan: Patient Active Problem List   Diagnosis Date Noted   CPAP use counseling 06/21/2023   Chronic pain syndrome 10/09/2021   Low back pain 10/09/2021   Sleep disturbance 02/05/2020   HTN (hypertension) 02/05/2020   Benign essential HTN 08/04/2019   OSA (obstructive sleep apnea) 08/04/2019   1. OSA (obstructive sleep apnea) (Primary) The patient does tolerate PAP and reports  benefit from PAP use. The patient was reminded how to clean equipment and advised to replace supplies routinely. The patient was also counselled on weight loss. The compliance is very good . The AHI is 0.3.   OSA on cpap- controlled. Continue with excellent compliance with pap. CPAP continues to be medically necessary to treat this patient's OSA. F/u one year.    2. CPAP use counseling CPAP Counseling: had a  lengthy discussion with the patient regarding the importance of PAP therapy in management of the sleep apnea. Patient appears to understand the risk factor reduction and also understands the risks associated with untreated sleep apnea. Patient will try to make a good faith effort to remain compliant with therapy. Also instructed the patient on proper cleaning of the device including the water must be changed daily if possible and use of distilled water is preferred. Patient understands that the machine should be regularly cleaned with appropriate recommended cleaning solutions that do not damage the PAP machine for example given white vinegar and water rinses. Other methods such as ozone treatment may not be as good as these simple methods to achieve cleaning.   3. Hypertension, unspecified type Controlled with telmisartan, metoprolol.      General Counseling: I have discussed the findings of the evaluation and examination with Taahir.  I have also discussed any further diagnostic evaluation thatmay be needed or ordered today. Shondale verbalizes understanding of the findings of todays visit. We also reviewed his medications today and discussed drug interactions and side effects including but not limited excessive drowsiness and altered mental states. We also discussed that there is always a risk not just to him but also people around him. he has been encouraged to call the office with any questions or concerns that should arise related to todays visit.  No orders of the defined types were placed in this encounter.       I have personally obtained a history, examined the patient, evaluated laboratory and imaging results, formulated the assessment and plan and placed orders. This patient was seen today by Emmaline Kluver, PA-C in collaboration with Dr. Freda Munro.   Yevonne Pax, MD [facility] Diplomate ABMS Pulmonary Critical Care Medicine and Sleep Medicine
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
